# Patient Record
Sex: Female | Born: 1965 | Race: White | Hispanic: No | Smoking: Former smoker
Health system: Southern US, Community
[De-identification: ages and names within clinical notes are randomized; demographics above are authoritative.]

## PROBLEM LIST (undated history)

## (undated) DIAGNOSIS — E785 Hyperlipidemia, unspecified: Secondary | ICD-10-CM

## (undated) DIAGNOSIS — I10 Essential (primary) hypertension: Secondary | ICD-10-CM

## (undated) DIAGNOSIS — Z9289 Personal history of other medical treatment: Secondary | ICD-10-CM

## (undated) DIAGNOSIS — C801 Malignant (primary) neoplasm, unspecified: Secondary | ICD-10-CM

## (undated) DIAGNOSIS — T8859XA Other complications of anesthesia, initial encounter: Secondary | ICD-10-CM

## (undated) DIAGNOSIS — T7840XA Allergy, unspecified, initial encounter: Secondary | ICD-10-CM

## (undated) DIAGNOSIS — C539 Malignant neoplasm of cervix uteri, unspecified: Secondary | ICD-10-CM

## (undated) DIAGNOSIS — Z01419 Encounter for gynecological examination (general) (routine) without abnormal findings: Secondary | ICD-10-CM

## (undated) DIAGNOSIS — K635 Polyp of colon: Secondary | ICD-10-CM

## (undated) DIAGNOSIS — M1990 Unspecified osteoarthritis, unspecified site: Secondary | ICD-10-CM

## (undated) DIAGNOSIS — Z8739 Personal history of other diseases of the musculoskeletal system and connective tissue: Secondary | ICD-10-CM

## (undated) DIAGNOSIS — E669 Obesity, unspecified: Secondary | ICD-10-CM

## (undated) HISTORY — PX: OTHER SURGICAL HISTORY: SHX169

## (undated) HISTORY — PX: TONSILLECTOMY: SUR1361

## (undated) HISTORY — DX: Allergy, unspecified, initial encounter: T78.40XA

## (undated) HISTORY — DX: Malignant (primary) neoplasm, unspecified: C80.1

## (undated) HISTORY — DX: Malignant neoplasm of cervix uteri, unspecified: C53.9

## (undated) HISTORY — PX: WISDOM TOOTH EXTRACTION: SHX21

## (undated) HISTORY — DX: Encounter for gynecological examination (general) (routine) without abnormal findings: Z01.419

## (undated) HISTORY — DX: Polyp of colon: K63.5

## (undated) HISTORY — PX: DILATION AND CURETTAGE OF UTERUS: SHX78

## (undated) HISTORY — DX: Personal history of other medical treatment: Z92.89

## (undated) HISTORY — DX: Essential (primary) hypertension: I10

## (undated) HISTORY — DX: Hyperlipidemia, unspecified: E78.5

## (undated) HISTORY — DX: Obesity, unspecified: E66.9

---

## 1999-05-08 ENCOUNTER — Other Ambulatory Visit: Admission: RE | Admit: 1999-05-08 | Discharge: 1999-05-08 | Payer: Self-pay | Admitting: Obstetrics and Gynecology

## 2000-02-06 ENCOUNTER — Encounter: Payer: Self-pay | Admitting: Obstetrics and Gynecology

## 2000-02-06 ENCOUNTER — Inpatient Hospital Stay (HOSPITAL_COMMUNITY): Admission: AD | Admit: 2000-02-06 | Discharge: 2000-02-06 | Payer: Self-pay | Admitting: Obstetrics and Gynecology

## 2000-02-09 ENCOUNTER — Encounter (INDEPENDENT_AMBULATORY_CARE_PROVIDER_SITE_OTHER): Payer: Self-pay | Admitting: Specialist

## 2000-02-09 ENCOUNTER — Other Ambulatory Visit: Admission: RE | Admit: 2000-02-09 | Discharge: 2000-02-09 | Payer: Self-pay | Admitting: Obstetrics and Gynecology

## 2002-01-20 ENCOUNTER — Other Ambulatory Visit: Admission: RE | Admit: 2002-01-20 | Discharge: 2002-01-20 | Payer: Self-pay | Admitting: Obstetrics and Gynecology

## 2003-02-20 ENCOUNTER — Other Ambulatory Visit: Admission: RE | Admit: 2003-02-20 | Discharge: 2003-02-20 | Payer: Self-pay | Admitting: Obstetrics and Gynecology

## 2003-11-08 ENCOUNTER — Encounter: Admission: RE | Admit: 2003-11-08 | Discharge: 2003-11-08 | Payer: Self-pay | Admitting: Family Medicine

## 2005-05-05 ENCOUNTER — Other Ambulatory Visit: Admission: RE | Admit: 2005-05-05 | Discharge: 2005-05-05 | Payer: Self-pay | Admitting: Obstetrics and Gynecology

## 2006-03-22 ENCOUNTER — Ambulatory Visit: Payer: Self-pay | Admitting: Family Medicine

## 2007-04-25 ENCOUNTER — Ambulatory Visit: Payer: Self-pay | Admitting: Family Medicine

## 2007-12-21 ENCOUNTER — Ambulatory Visit: Payer: Self-pay | Admitting: Family Medicine

## 2008-10-03 ENCOUNTER — Ambulatory Visit: Payer: Self-pay | Admitting: Family Medicine

## 2009-06-26 ENCOUNTER — Ambulatory Visit: Payer: Self-pay | Admitting: Family Medicine

## 2010-04-08 ENCOUNTER — Ambulatory Visit: Payer: Self-pay | Admitting: Physician Assistant

## 2011-02-03 ENCOUNTER — Other Ambulatory Visit: Payer: Self-pay | Admitting: Family Medicine

## 2011-02-03 NOTE — Telephone Encounter (Signed)
Called in lisinopril 20/25 #30 0 refill

## 2011-02-13 ENCOUNTER — Encounter: Payer: Self-pay | Admitting: Family Medicine

## 2011-02-17 ENCOUNTER — Telehealth: Payer: Self-pay

## 2011-02-17 ENCOUNTER — Ambulatory Visit (INDEPENDENT_AMBULATORY_CARE_PROVIDER_SITE_OTHER): Payer: 59 | Admitting: Family Medicine

## 2011-02-17 ENCOUNTER — Encounter: Payer: Self-pay | Admitting: Family Medicine

## 2011-02-17 VITALS — BP 130/86 | HR 72 | Wt 293.0 lb

## 2011-02-17 DIAGNOSIS — Z79899 Other long term (current) drug therapy: Secondary | ICD-10-CM

## 2011-02-17 DIAGNOSIS — E785 Hyperlipidemia, unspecified: Secondary | ICD-10-CM

## 2011-02-17 DIAGNOSIS — Z800 Family history of malignant neoplasm of digestive organs: Secondary | ICD-10-CM

## 2011-02-17 DIAGNOSIS — J309 Allergic rhinitis, unspecified: Secondary | ICD-10-CM | POA: Insufficient documentation

## 2011-02-17 DIAGNOSIS — J3089 Other allergic rhinitis: Secondary | ICD-10-CM

## 2011-02-17 DIAGNOSIS — I10 Essential (primary) hypertension: Secondary | ICD-10-CM

## 2011-02-17 LAB — CBC WITH DIFFERENTIAL/PLATELET
Basophils Absolute: 0 10*3/uL (ref 0.0–0.1)
Eosinophils Absolute: 0.1 10*3/uL (ref 0.0–0.7)
Eosinophils Relative: 1 % (ref 0–5)
HCT: 40.9 % (ref 36.0–46.0)
MCH: 31.5 pg (ref 26.0–34.0)
MCHC: 32.5 g/dL (ref 30.0–36.0)
MCV: 96.9 fL (ref 78.0–100.0)
Monocytes Absolute: 0.5 10*3/uL (ref 0.1–1.0)
Platelets: 244 10*3/uL (ref 150–400)
RDW: 12.8 % (ref 11.5–15.5)

## 2011-02-17 LAB — LIPID PANEL
Cholesterol: 144 mg/dL (ref 0–200)
HDL: 52 mg/dL (ref >39)
Total CHOL/HDL Ratio: 2.8 Ratio
VLDL: 12 mg/dL (ref 0–40)

## 2011-02-17 LAB — COMPREHENSIVE METABOLIC PANEL
ALT: 27 U/L (ref 0–35)
AST: 20 U/L (ref 0–37)
Albumin: 4.3 g/dL (ref 3.5–5.2)
Alkaline Phosphatase: 83 U/L (ref 39–117)
BUN: 12 mg/dL (ref 6–23)
CO2: 24 mEq/L (ref 19–32)
Calcium: 9.3 mg/dL (ref 8.4–10.5)
Chloride: 101 mEq/L (ref 96–112)
Creat: 0.68 mg/dL (ref 0.50–1.10)
Glucose, Bld: 102 mg/dL — ABNORMAL HIGH (ref 70–99)
Potassium: 4.2 mEq/L (ref 3.5–5.3)
Sodium: 136 mEq/L (ref 135–145)
Total Bilirubin: 0.6 mg/dL (ref 0.3–1.2)
Total Protein: 6.8 g/dL (ref 6.0–8.3)

## 2011-02-17 MED ORDER — LISINOPRIL-HYDROCHLOROTHIAZIDE 20-25 MG PO TABS: 1.0000 | ORAL_TABLET | Freq: Every day | ORAL | Status: DC

## 2011-02-17 MED ORDER — ATORVASTATIN CALCIUM 20 MG PO TABS: 20.0000 mg | ORAL_TABLET | Freq: Every day | ORAL | Status: DC

## 2011-02-17 NOTE — Telephone Encounter (Signed)
LEFT MESSAGE FOR PT NURSE VISIT FOR PREP IS June 27 @ 1;30 PROCEDURE July 11 @ 200 CHECK IN AT 1 LE [facility] [phone]

## 2011-02-17 NOTE — Patient Instructions (Signed)
Take Zyrtec regularly for the next week or 2 to see if this will stop the cough. If it doesn't work then stop your blood pressure medicine and see if that affects her coughing. And let me know

## 2011-02-17 NOTE — Progress Notes (Signed)
  Subjective:    Patient ID: Christina Velasquez, female    DOB: 1966-06-06, 45 y.o.   MRN: [id number]  HPI He is here for medication recheck. She continues on her lisinopril and Lipitor. She has noted over the last several months a slight dry cough. She does have underlying allergies. She states that her cough is usually more in the morning and again in the afternoons she does have some sneezing, itchy watery eyes and rhinorrhea. She does use Zyrtec     Review of Systems Negative except as above    Objective:   Physical Exam alert and in no distress. Tympanic membranes and canals are normal. Throat is clear. Tonsils are normal. Neck is supple without adenopathy or thyromegaly. Cardiac exam shows a regular sinus rhythm without murmurs or gallops. Lungs are clear to auscultation.        Assessment & Plan:  Hypertension. Hyperlipidemia. Obesity. Cough. Allergic rhinitis  Routine blood screening. Also recommend she use Zyrtec regularly to see if this will help with the cough. If not then she is to stop her lisinopril and let me know if this helps the cough. I also discussed routine followup in regard to mammogram, colonoscopy and Pap smears.

## 2011-02-18 ENCOUNTER — Telehealth: Payer: Self-pay

## 2011-02-18 NOTE — Telephone Encounter (Signed)
Left message labs look good 

## 2011-03-03 ENCOUNTER — Ambulatory Visit (AMBULATORY_SURGERY_CENTER): Payer: 59 | Admitting: *Deleted

## 2011-03-03 ENCOUNTER — Encounter: Payer: Self-pay | Admitting: Gastroenterology

## 2011-03-03 VITALS — Ht 68.0 in | Wt 299.9 lb

## 2011-03-03 DIAGNOSIS — Z800 Family history of malignant neoplasm of digestive organs: Secondary | ICD-10-CM

## 2011-03-03 MED ORDER — PEG-KCL-NACL-NASULF-NA ASC-C 100 G PO SOLR: ORAL | Status: DC

## 2011-03-05 ENCOUNTER — Other Ambulatory Visit: Payer: Self-pay | Admitting: Family Medicine

## 2011-03-18 ENCOUNTER — Encounter: Payer: Self-pay | Admitting: Gastroenterology

## 2011-03-18 ENCOUNTER — Ambulatory Visit (AMBULATORY_SURGERY_CENTER): Payer: 59 | Admitting: Gastroenterology

## 2011-03-18 VITALS — HR 84 | Temp 97.1°F | Resp 100 | Ht 68.0 in | Wt 295.0 lb

## 2011-03-18 DIAGNOSIS — D126 Benign neoplasm of colon, unspecified: Secondary | ICD-10-CM

## 2011-03-18 DIAGNOSIS — K5730 Diverticulosis of large intestine without perforation or abscess without bleeding: Secondary | ICD-10-CM

## 2011-03-18 DIAGNOSIS — Z1211 Encounter for screening for malignant neoplasm of colon: Secondary | ICD-10-CM

## 2011-03-18 DIAGNOSIS — Z800 Family history of malignant neoplasm of digestive organs: Secondary | ICD-10-CM

## 2011-03-18 DIAGNOSIS — Z86010 Personal history of colonic polyps: Secondary | ICD-10-CM | POA: Insufficient documentation

## 2011-03-18 HISTORY — DX: Benign neoplasm of colon, unspecified: D12.6

## 2011-03-18 MED ORDER — SODIUM CHLORIDE 0.9 % IV SOLN: 500.0000 mL | INTRAVENOUS | Status: DC

## 2011-03-18 NOTE — Patient Instructions (Signed)
Discharge instructions given with verbal understanding. Handout on polyps and diverticulosis given. Resume previous medications. 

## 2011-03-19 ENCOUNTER — Telehealth: Payer: Self-pay

## 2011-03-19 NOTE — Telephone Encounter (Signed)

## 2011-03-24 ENCOUNTER — Encounter: Payer: Self-pay | Admitting: Gastroenterology

## 2011-03-29 DIAGNOSIS — K635 Polyp of colon: Secondary | ICD-10-CM

## 2011-03-29 HISTORY — DX: Polyp of colon: K63.5

## 2011-08-10 ENCOUNTER — Encounter: Payer: Self-pay | Admitting: Medical

## 2011-08-10 ENCOUNTER — Ambulatory Visit (INDEPENDENT_AMBULATORY_CARE_PROVIDER_SITE_OTHER): Payer: 59 | Admitting: Medical

## 2011-08-10 VITALS — BP 112/80 | HR 72 | Temp 98.2°F | Resp 16 | Wt 291.0 lb

## 2011-08-10 DIAGNOSIS — J40 Bronchitis, not specified as acute or chronic: Secondary | ICD-10-CM

## 2011-08-10 DIAGNOSIS — Z23 Encounter for immunization: Secondary | ICD-10-CM

## 2011-08-10 DIAGNOSIS — J329 Chronic sinusitis, unspecified: Secondary | ICD-10-CM | POA: Insufficient documentation

## 2011-08-10 MED ORDER — AMOXICILLIN 500 MG PO TABS: ORAL_TABLET | ORAL | Status: DC

## 2011-08-10 MED ORDER — BENZONATATE 100 MG PO CAPS: 100.0000 mg | ORAL_CAPSULE | Freq: Four times a day (QID) | ORAL | Status: DC | PRN

## 2011-08-10 NOTE — Progress Notes (Signed)
Subjective:   HPI  Christina Velasquez is a 45 y.o. female who presents with 4 day hx/o cough, tightness in chest, sinus and head congestion, malaise, fever to 103, chills, sweats, and sick contact at work.   She feels as though symptoms are worsening into chest.   Using Tylenol and Motrin for fever.  She does notes hx/o bronchitis, but denies hx/o tobacco use.  Using Mucinex OTC.  No other aggravating or relieving factors.    No other c/o.  The following portions of the patient's history were reviewed and updated as appropriate: allergies, current medications, past family history, past medical history, past social history, past surgical history and problem list.  Past Medical History  Diagnosis Date  . Hypertension   . Obesity   . Dyslipidemia   . Allergy   . Cancer     cervix   Review of Systems Constitutional: +fever, +chills, +sweats, -unexpected -weight change,-fatigue ENT: +runny nose, +ear pain, -sore throat Cardiology:  -chest pain, -palpitations, -edema Respiratory: +cough, +shortness of breath, -wheezing Gastroenterology: -abdominal pain, -nausea, -vomiting, -diarrhea, -constipation Hematology: -bleeding or bruising problems Musculoskeletal: -arthralgias, -myalgias, -joint swelling, +back pain Ophthalmology: -vision changes Urology: -dysuria, -difficulty urinating, -hematuria, -urinary frequency, -urgency Neurology: +headache, -weakness, -tingling, -numbness   Objective:   Filed Vitals:   08/10/11 1350  BP: 112/80  Pulse: 72  Temp: 98.2 F (36.8 C)  Resp: 16    General appearance: Alert, WD/WN, no distress, ill appearing, deep rattly cough                             Skin: warm, no rash, no diaphoresis                           Head: no sinus tenderness                            Eyes: conjunctiva normal, corneas clear, PERRLA                            Ears: pearly TMs, external ear canals normal                          Nose: septum midline, turbinates  swollen, with erythema and clear discharge             Mouth/throat: MMM, tongue normal, mild pharyngeal erythema                           Neck: supple, no adenopathy, no thyromegaly, nontender                          Heart: RRR, normal S1, S2, no murmurs                         Lungs: +bronchial breath sounds, +scattered rhonchi, no wheezes, no rales                Extremities: no edema, nontender     Assessment and Plan:   Encounter Diagnoses  Name Primary?  . Sinusitis Yes  . Bronchitis   . Need for prophylactic vaccination and inoculation against influenza    Prescription given today for Amoxicillin and Tessalon  Perles as below.  Discussed diagnosis and treatment.  Suggested symptomatic OTC remedies for cough and congestion.  Nasal saline spray for nasal congestion.  Tylenol or Ibuprofen OTC for fever and malaise.  Call/return in 2-3 days if symptoms are worse or not improving.  Advised that cough may linger even after the infection is improved.     VIS, vaccine counseling, and flu vaccine given.

## 2011-08-10 NOTE — Patient Instructions (Signed)
Acute Bronchitis Bronchitis is a problem of the air tubes leading to your lungs. Acute means the illness started quickly. In this condition, the lining of those tubes becomes puffy (swollen) and can leak fluid. This makes it harder for air to get in and out of your lungs. You may cough a lot. This is because the air tubes are narrow. Bronchitis is most often caused by a virus. Medicines that kill germs (antibiotics) may be needed with germ (bacteria) infections for people who:  Smoke.   Have lasting (chronic) lung problems.   Are elderly.  HOME CARE  Rest.   Drink enough water and fluids to keep the pee clear or pale yellow.   Only take medicine as told by your doctor.   Medicines may be prescribed that will open up the airways. This will help make breathing easier.   Bronchitis usually gets better on its own in a few days.  Recovery from some problems (symptoms) of bronchitis may be slow. You should start feeling a little better after 2 to 3 days. Coughing may last for 3 to 4 weeks. GET HELP RIGHT AWAY IF:  You or your child has a temperature by mouth above 101, not controlled by medicine.   Chills or chest pain develops.   You or your child develops very bad shortness of breath.   There is bloody saliva mixed with mucus (sputum).   You or your child throws up (vomits) often, loses too much fluid (dehydration), feels faint, or has a very bad headache.   You or your child does not improve after 1 week of treatment.  MAKE SURE YOU:   Understand these instructions.   Will watch this condition.   Will get help right away if you or your child is not doing well or gets worse.  Document Released: 02/10/2008 Document Re-Released: 11/18/2009 [employer] Patient Information 2011 [employer].  

## 2011-09-24 ENCOUNTER — Ambulatory Visit (INDEPENDENT_AMBULATORY_CARE_PROVIDER_SITE_OTHER): Payer: 59 | Admitting: Family Medicine

## 2011-09-24 ENCOUNTER — Ambulatory Visit (HOSPITAL_COMMUNITY)
Admission: RE | Admit: 2011-09-24 | Discharge: 2011-09-24 | Disposition: A | Discharge status: Home / Self Care | Payer: 59 | Source: Ambulatory Visit | Attending: Family Medicine | Admitting: Family Medicine

## 2011-09-24 ENCOUNTER — Encounter: Payer: Self-pay | Admitting: Family Medicine

## 2011-09-24 VITALS — BP 126/80 | HR 76 | Ht 68.0 in | Wt 294.0 lb

## 2011-09-24 DIAGNOSIS — M79604 Pain in right leg: Secondary | ICD-10-CM

## 2011-09-24 DIAGNOSIS — M79609 Pain in unspecified limb: Secondary | ICD-10-CM

## 2011-09-24 NOTE — Progress Notes (Signed)
  Subjective:    Patient ID: Christina Velasquez, female    DOB: 11-Jun-1966, 46 y.o.   MRN: [id number]  HPI Approximately one month ago she fell and did injure her right leg. Since then she has had intermittent right calf pain especially when she would go to sleep. 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 In the last week she has noted increased right calf pain. No cough, congestion, shortness of breath or chest pain   Review of Systems     Objective:   Physical Exam Alert and in no distress. Exam of her right lower extremity does show positive Homans sign. It is not warm or swollen. Both calves are the same size. Venous Dopplers were ordered and were negative.       Assessment & Plan:  Calf pain. Recommend heat and Aleve. She will call if further difficulties

## 2011-09-24 NOTE — Progress Notes (Signed)
*  PRELIMINARY RESULTS*   Right lower extremity venous Doppler completed.  There is no DVT, SVT, or Baker's cyst in the right lower extremity.   Christina Velasquez 09/24/2011, 2:55 PM

## 2011-09-24 NOTE — Patient Instructions (Signed)
Recommend conservative care with heat and anti-inflammatory of choice.

## 2012-03-10 ENCOUNTER — Other Ambulatory Visit: Payer: Self-pay | Admitting: Family Medicine

## 2012-03-28 ENCOUNTER — Encounter: Payer: 59 | Admitting: Family Medicine

## 2012-04-04 ENCOUNTER — Telehealth: Payer: Self-pay | Admitting: Family Medicine

## 2012-04-04 ENCOUNTER — Ambulatory Visit (INDEPENDENT_AMBULATORY_CARE_PROVIDER_SITE_OTHER): Payer: 59 | Admitting: Family Medicine

## 2012-04-04 ENCOUNTER — Encounter: Payer: Self-pay | Admitting: Family Medicine

## 2012-04-04 VITALS — BP 128/80 | HR 87 | Wt 293.0 lb

## 2012-04-04 DIAGNOSIS — Z6841 Body Mass Index (BMI) 40.0 and over, adult: Secondary | ICD-10-CM

## 2012-04-04 DIAGNOSIS — E785 Hyperlipidemia, unspecified: Secondary | ICD-10-CM

## 2012-04-04 DIAGNOSIS — Z79899 Other long term (current) drug therapy: Secondary | ICD-10-CM

## 2012-04-04 DIAGNOSIS — Z23 Encounter for immunization: Secondary | ICD-10-CM

## 2012-04-04 DIAGNOSIS — I10 Essential (primary) hypertension: Secondary | ICD-10-CM

## 2012-04-04 LAB — LIPID PANEL
LDL Cholesterol: 85 mg/dL (ref 0–99)
Triglycerides: 64 mg/dL (ref <150)

## 2012-04-04 LAB — COMPREHENSIVE METABOLIC PANEL
Albumin: 4.3 g/dL (ref 3.5–5.2)
Alkaline Phosphatase: 69 U/L (ref 39–117)
CO2: 26 mEq/L (ref 19–32)
Calcium: 9.5 mg/dL (ref 8.4–10.5)
Chloride: 103 mEq/L (ref 96–112)
Glucose, Bld: 97 mg/dL (ref 70–99)
Potassium: 4.2 mEq/L (ref 3.5–5.3)
Sodium: 138 mEq/L (ref 135–145)
Total Protein: 6.8 g/dL (ref 6.0–8.3)

## 2012-04-04 LAB — CBC WITH DIFFERENTIAL/PLATELET
Basophils Relative: 1 % (ref 0–1)
HCT: 39.2 % (ref 36.0–46.0)
Hemoglobin: 13.6 g/dL (ref 12.0–15.0)
Lymphocytes Relative: 28 % (ref 12–46)
Lymphs Abs: 1.8 10*3/uL (ref 0.7–4.0)
Monocytes Absolute: 0.5 10*3/uL (ref 0.1–1.0)
Monocytes Relative: 8 % (ref 3–12)
Neutro Abs: 3.8 10*3/uL (ref 1.7–7.7)
Neutrophils Relative %: 61 % (ref 43–77)
RBC: 4.22 MIL/uL (ref 3.87–5.11)
WBC: 6.2 10*3/uL (ref 4.0–10.5)

## 2012-04-04 MED ORDER — ATORVASTATIN CALCIUM 20 MG PO TABS: 20.0000 mg | ORAL_TABLET | Freq: Every day | ORAL | Status: DC

## 2012-04-04 MED ORDER — LISINOPRIL-HYDROCHLOROTHIAZIDE 20-25 MG PO TABS: 1.0000 | ORAL_TABLET | Freq: Every day | ORAL | Status: DC

## 2012-04-04 NOTE — Progress Notes (Signed)
  Subjective:    Patient ID: Christina Velasquez, female    DOB: 04-22-1966, 46 y.o.   MRN: [id number]  HPI She is here for medication check. She needs refills on her Lipitor. She also continues on lisinopril and is having no difficulty with this. Review of record indicates need for followup tetanus shot. She also has not had blood work and approximately one year. Her allergies seem to be under good control. The main issue she is having is with back pain. Review of record indicates she does have some arthritic changes and spurring. She complains of upper as well as lower back discomfort She has been seen by orthopedics in the past. She is interested in potentially getting attacks break on the recent purchase of a therapeutic pole.   Review of Systems     Objective:   Physical Exam alert and in no distress. Tympanic membranes and canals are normal. Throat is clear. Tonsils are normal. Neck is supple without adenopathy or thyromegaly. Cardiac exam shows a regular sinus rhythm without murmurs or gallops. Lungs are clear to auscultation.       Assessment & Plan:   1. Morbid obesity with BMI of 40.0-44.9, adult    2. Hyperlipidemia  atorvastatin (LIPITOR) 20 MG tablet  3. Hypertension  lisinopril-hydrochlorothiazide (PRINZIDE,ZESTORETIC) 20-25 MG per tablet  4. Encounter for long-term (current) use of other medications  Tdap vaccine greater than or equal to 7yo IM, CBC with Differential, Comprehensive metabolic panel, Lipid panel   her medications were reviewed. He Given. Discussed her physical activities as well as general lifestyle in regard to diet and exercise. 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 At this point she is not ready to sit down with a nutritionist or therapist concerning all this to

## 2012-04-13 NOTE — Telephone Encounter (Signed)
LM

## 2012-05-19 ENCOUNTER — Other Ambulatory Visit: Payer: Self-pay | Admitting: Family Medicine

## 2012-09-07 HISTORY — PX: COLONOSCOPY: SHX174

## 2012-11-14 ENCOUNTER — Other Ambulatory Visit: Payer: Self-pay | Admitting: Family Medicine

## 2013-01-17 ENCOUNTER — Other Ambulatory Visit: Payer: Self-pay | Admitting: Family Medicine

## 2013-04-04 ENCOUNTER — Encounter: Payer: Self-pay | Admitting: Family Medicine

## 2013-04-04 ENCOUNTER — Ambulatory Visit (INDEPENDENT_AMBULATORY_CARE_PROVIDER_SITE_OTHER): Payer: 59 | Admitting: Family Medicine

## 2013-04-04 VITALS — BP 130/88 | HR 70 | Wt 289.0 lb

## 2013-04-04 DIAGNOSIS — M25569 Pain in unspecified knee: Secondary | ICD-10-CM

## 2013-04-04 DIAGNOSIS — M25561 Pain in right knee: Secondary | ICD-10-CM

## 2013-04-04 NOTE — Progress Notes (Signed)
  Subjective:    Patient ID: Christina Velasquez, female    DOB: 1966-02-10, 47 y.o.   MRN: [id number]  HPI She is here for reevaluation of right leg pain. She had an evaluation in January 2013 mainly for DVT which was negative  and continues to have pain. It bothers her the most when she is lying or sitting. Standing actually improves the pain. Apparently the symptoms are getting worse and she is now limping.   Review of Systems     Objective:   Physical Exam No palpable tenderness in the popliteal fossa area. Negative Homan's sign. Pulses are normal. Good motion of her knee.       Assessment & Plan:  Right knee pain - Plan: DG Knee 1-2 Views Right, Ambulatory referral to Orthopedic Surgery  I will refer her specifically to Dr. Prince Rome for possible ultrasound of this area.

## 2013-04-10 ENCOUNTER — Ambulatory Visit
Admission: RE | Admit: 2013-04-10 | Discharge: 2013-04-10 | Disposition: A | Discharge status: Home / Self Care | Payer: 59 | Source: Ambulatory Visit | Attending: Family Medicine | Admitting: Family Medicine

## 2013-04-10 DIAGNOSIS — M25561 Pain in right knee: Secondary | ICD-10-CM

## 2013-04-18 ENCOUNTER — Encounter: Payer: 59 | Admitting: Family Medicine

## 2013-04-21 ENCOUNTER — Other Ambulatory Visit: Payer: Self-pay | Admitting: Family Medicine

## 2013-04-21 ENCOUNTER — Ambulatory Visit (INDEPENDENT_AMBULATORY_CARE_PROVIDER_SITE_OTHER): Payer: 59 | Admitting: Family Medicine

## 2013-04-21 ENCOUNTER — Encounter: Payer: Self-pay | Admitting: Family Medicine

## 2013-04-21 VITALS — BP 122/80 | HR 77 | Wt 289.0 lb

## 2013-04-21 DIAGNOSIS — I10 Essential (primary) hypertension: Secondary | ICD-10-CM

## 2013-04-21 DIAGNOSIS — J3089 Other allergic rhinitis: Secondary | ICD-10-CM

## 2013-04-21 DIAGNOSIS — Z800 Family history of malignant neoplasm of digestive organs: Secondary | ICD-10-CM

## 2013-04-21 DIAGNOSIS — E785 Hyperlipidemia, unspecified: Secondary | ICD-10-CM

## 2013-04-21 MED ORDER — LISINOPRIL-HYDROCHLOROTHIAZIDE 20-25 MG PO TABS: ORAL_TABLET | ORAL | Status: DC

## 2013-04-21 MED ORDER — ATORVASTATIN CALCIUM 20 MG PO TABS: ORAL_TABLET | ORAL | Status: DC

## 2013-04-21 NOTE — Patient Instructions (Addendum)
Take time out for yourself. Knowledge unused is wasted Use the serenity prayer.

## 2013-04-21 NOTE — Progress Notes (Signed)
  Subjective:    Patient ID: Christina Velasquez, female    DOB: 07/09/1966, 47 y.o.   MRN: [id number]  HPI She is here for medication check. She ran out of her Lipitor 2 weeks ago. When asked her why she ran out and did not call me, she could not give a good response.  She is about to run out of the lisinopril . She admits to not dieting and exercising and blames this on her very busy work schedule. Her chart was reviewed. She is up-to-date on her general medical needs. Social and family history were reviewed.  Review of Systems     Objective:   Physical Exam Alert and in no distress otherwise not examined       Assessment & Plan:  Obesity, Class III, BMI 40-49.9 (morbid obesity)  Hypertension  Hyperlipidemia  Family history of colon cancer  Allergic rhinitis due to other allergen  over half an hour spent discussing her lifestyle especially revolving around work. She admits to being a control freak. She admits to taking over spots ability is of other people. I then discussed the possibility of her getting involved in counseling however she did not seem interested in this. She recognizes her problem but at this point seems to be unwilling to make any changes. I stressed the need for her to put her own medical needs higher in her priority list. I will renew her medications and recheck here in several months.

## 2013-06-21 ENCOUNTER — Encounter: Payer: Self-pay | Admitting: Internal Medicine

## 2013-06-21 ENCOUNTER — Encounter: Payer: Self-pay | Admitting: Family Medicine

## 2013-06-21 ENCOUNTER — Ambulatory Visit (INDEPENDENT_AMBULATORY_CARE_PROVIDER_SITE_OTHER): Payer: 59 | Admitting: Family Medicine

## 2013-06-21 VITALS — BP 120/80 | HR 87 | Wt 294.0 lb

## 2013-06-21 DIAGNOSIS — Z79899 Other long term (current) drug therapy: Secondary | ICD-10-CM

## 2013-06-21 DIAGNOSIS — E785 Hyperlipidemia, unspecified: Secondary | ICD-10-CM

## 2013-06-21 DIAGNOSIS — I10 Essential (primary) hypertension: Secondary | ICD-10-CM

## 2013-06-21 DIAGNOSIS — E66813 Obesity, class 3: Secondary | ICD-10-CM

## 2013-06-21 LAB — LIPID PANEL
Cholesterol: 138 mg/dL (ref 0–200)
LDL Cholesterol: 71 mg/dL (ref 0–99)
Total CHOL/HDL Ratio: 2.5 Ratio
Triglycerides: 57 mg/dL (ref <150)
VLDL: 11 mg/dL (ref 0–40)

## 2013-06-21 LAB — COMPREHENSIVE METABOLIC PANEL
Alkaline Phosphatase: 72 U/L (ref 39–117)
Creat: 0.68 mg/dL (ref 0.50–1.10)
Glucose, Bld: 91 mg/dL (ref 70–99)
Sodium: 138 mEq/L (ref 135–145)
Total Bilirubin: 0.7 mg/dL (ref 0.3–1.2)
Total Protein: 6.2 g/dL (ref 6.0–8.3)

## 2013-06-21 LAB — CBC WITH DIFFERENTIAL/PLATELET
Basophils Relative: 0 % (ref 0–1)
Eosinophils Absolute: 0.2 10*3/uL (ref 0.0–0.7)
Eosinophils Relative: 3 % (ref 0–5)
MCH: 31.6 pg (ref 26.0–34.0)
MCHC: 33.5 g/dL (ref 30.0–36.0)
MCV: 94.4 fL (ref 78.0–100.0)
Monocytes Relative: 6 % (ref 3–12)
Neutrophils Relative %: 72 % (ref 43–77)
Platelets: 228 10*3/uL (ref 150–400)

## 2013-06-21 NOTE — Progress Notes (Signed)
  Subjective:    Patient ID: Christina Velasquez, female    DOB: [birth year], 47 y.o.   MRN: [id number]  HPI She is here for recheck. She is on medications listed in the chart. She continues to have difficulty mainly with knee pain. She is being followed by orthopedics for this. She does intermittently make efforts to lose weight but usually allows everything to get in the way.   Review of Systems     Objective:   Physical Exam Alert and in no distress. Blood pressure is recorded.      Assessment & Plan:  Obesity, Class III, BMI 40-49.9 (morbid obesity) - Plan: CBC with Differential, Comprehensive metabolic panel, Lipid panel  Hypertension - Plan: CBC with Differential, Comprehensive metabolic panel  Hyperlipidemia - Plan: CBC with Differential, Comprehensive metabolic panel  Encounter for long-term (current) use of other medications - Plan: CBC with Differential, Comprehensive metabolic panel, Lipid panel  discussed in detail lifestyle modification however in her case she seems to put everybody else's wants needs and desires above her own. Discussed the fact that counseling could be very useful to help with this. At this time she is not interested.  She has already had a flu shot.

## 2013-08-01 LAB — HM PAP SMEAR

## 2013-08-01 LAB — RESULTS CONSOLE HPV: CHL HPV: NEGATIVE

## 2013-08-15 LAB — HM MAMMOGRAPHY

## 2013-09-13 ENCOUNTER — Ambulatory Visit (INDEPENDENT_AMBULATORY_CARE_PROVIDER_SITE_OTHER): Payer: 59 | Admitting: Medical

## 2013-09-13 ENCOUNTER — Encounter: Payer: Self-pay | Admitting: Medical

## 2013-09-13 VITALS — BP 100/70 | HR 96 | Temp 98.3°F | Resp 18 | Wt 296.0 lb

## 2013-09-13 DIAGNOSIS — J329 Chronic sinusitis, unspecified: Secondary | ICD-10-CM

## 2013-09-13 DIAGNOSIS — J40 Bronchitis, not specified as acute or chronic: Secondary | ICD-10-CM

## 2013-09-13 DIAGNOSIS — R05 Cough: Secondary | ICD-10-CM

## 2013-09-13 DIAGNOSIS — R059 Cough, unspecified: Secondary | ICD-10-CM

## 2013-09-13 DIAGNOSIS — J3489 Other specified disorders of nose and nasal sinuses: Secondary | ICD-10-CM

## 2013-09-13 MED ORDER — METHYLPREDNISOLONE (PAK) 4 MG PO TABS: ORAL_TABLET | ORAL | Status: DC

## 2013-09-13 MED ORDER — HYDROCODONE-HOMATROPINE 5-1.5 MG/5ML PO SYRP: 5.0000 mL | ORAL_SOLUTION | Freq: Three times a day (TID) | ORAL | Status: DC | PRN

## 2013-09-13 MED ORDER — ALBUTEROL SULFATE HFA 108 (90 BASE) MCG/ACT IN AERS: 2.0000 | INHALATION_SPRAY | Freq: Four times a day (QID) | RESPIRATORY_TRACT | Status: DC | PRN

## 2013-09-13 MED ORDER — CLARITHROMYCIN 500 MG PO TABS: 500.0000 mg | ORAL_TABLET | Freq: Two times a day (BID) | ORAL | Status: DC

## 2013-09-13 NOTE — Progress Notes (Signed)
Subjective: Here for illness since Thanksgiving.  Current symptoms include sinus pressure, pressure in ears, left maxillary/orbital pain, cough, cough worse at night.  Using Mucinex, has used some sudafed, day quill, Nyquil.  Did get flu shot this year.  Had fever thanksgiving weekend, but none sense.   With the first symptoms at Thanksgiving, had sore throat, rash all over.  Denies SOB, wheezing.  No swelling in legs.  No NVD.   Still has some of the rash on legs but healing up on arms and abdomen.   Been using benadryl for this.  Work sick contacts.  No sick contacts and measles rubella. She did get her childhood vaccines. No exposures to people from other countries recently  Past Medical History  Diagnosis Date  . Hypertension   . Obesity   . Dyslipidemia   . Allergy   . Cancer     cervix   ROS as in subjective  Objective: Filed Vitals:   09/13/13 0824  BP: 100/70  Pulse: 96  Temp: 98.3 F (36.8 C)  Resp: 18    General appearance: alert, no distress, WD/WN  HEENT: normocephalic, sclerae anicteric, TMs pearly, nares with turbinate edema, mucoid discharge mild erythema, pharynx normal Oral cavity: MMM, no lesions Neck: supple, no lymphadenopathy, no thyromegaly, no masses Heart: RRR, normal S1, S2, no murmurs Lungs: CTA bilaterally, no wheezes, rhonchi, or rales Pulses: 2+ symmetric, upper and lower extremities, normal cap refill Ext: no edema CN: Warm, dry, there is a faintly macular rough rash along her bilateral legs, lower legs, otherwise no obvious rash   Chest x-ray shows bronchial inflammation, otherwise normal cardiac silhouette, no mass, no free air otherwise unremarkable.  Assessment: Encounter Diagnoses  Name Primary?  . Cough Yes  . Sinus pressure     Plan: Prescriptions today for albuterol inhaler, Medrol Dosepak, Biaxin antibiotic, and Hycodan syrup when necessary use. Advise rest, good hydration, can continue the medications for congestion she is taking,  Follow-up if worse or not improving within a week

## 2013-11-27 ENCOUNTER — Encounter: Payer: Self-pay | Admitting: Medical

## 2013-11-27 ENCOUNTER — Ambulatory Visit (INDEPENDENT_AMBULATORY_CARE_PROVIDER_SITE_OTHER): Payer: 59 | Admitting: Medical

## 2013-11-27 VITALS — BP 112/78 | HR 88 | Temp 98.4°F | Resp 16 | Wt 305.0 lb

## 2013-11-27 DIAGNOSIS — L500 Allergic urticaria: Secondary | ICD-10-CM

## 2013-11-27 DIAGNOSIS — T783XXA Angioneurotic edema, initial encounter: Secondary | ICD-10-CM

## 2013-11-27 MED ORDER — EPINEPHRINE 0.3 MG/0.3ML IJ SOAJ: 0.3000 mg | Freq: Once | INTRAMUSCULAR | Status: DC

## 2013-11-27 MED ORDER — HYDROCHLOROTHIAZIDE 25 MG PO TABS: 25.0000 mg | ORAL_TABLET | Freq: Every day | ORAL | Status: DC

## 2013-11-27 MED ORDER — NEBIVOLOL HCL 5 MG PO TABS: 5.0000 mg | ORAL_TABLET | Freq: Every day | ORAL | Status: DC

## 2013-11-27 MED ORDER — METHYLPREDNISOLONE SODIUM SUCC 125 MG IJ SOLR
125.0000 mg | Freq: Once | INTRAMUSCULAR | Status: AC
  Administered 2013-11-27: 125 mg via INTRAMUSCULAR

## 2013-11-27 NOTE — Progress Notes (Signed)
Subjective: Here for followup on rash.  I saw her in January for a respiratory infection, and at that time she had a very mild itchy rash, but the rash has worsened throughout the body, has continued since January.  She is taking Benadryl quite regularly and she will have periods of time where the rash gets a little better, but the rash just won't go away.  Rash is very itchy, and this past weekend Sunday and Monday had swelling of face, left side more and tongue felt a little swelling.  Denies cough.  No prior similar reaction.  The only thing that's been a common feature is eating some slowly-based foods, but she cannot think of any other trigger. She is on lisinopril HCT with no prior problem with lisinopril. She has never been on any other blood pressure medication, and if she is off the lisinopril for 2 days she gets terrible headaches.  Review of Systems Constitutional: -fever, -chills, -sweats, -unexpected weight change,-fatigue ENT: -runny nose, -ear pain, -sore throat Cardiology:  -chest pain, -palpitations, -edema Respiratory: -cough, -shortness of breath, -wheezing Gastroenterology: -abdominal pain, -nausea, -vomiting, -diarrhea, -constipation Hematology: -bleeding or bruising problems Musculoskeletal: -arthralgias, -myalgias, -joint swelling, -back pain Ophthalmology: -vision changes Urology: -dysuria, -difficulty urinating, -hematuria, -urinary frequency, -urgency Neurology: -headache, -weakness, -tingling, -numbness   Filed Vitals:   11/27/13 0842  BP: 112/78  Pulse: 88  Temp: 98.4 F (36.9 C)  Resp: 16    General appearance: alert, no distress, WD/WN Skin: She seems to have small urticarial raised erythematous/pink papular wheal lesions throughout the body, legs, arms, torso, several are scaling due to her excoriations from scratching HEENT: normocephalic, sclerae anicteric, TMs pearly, nares patent, no discharge or erythema, pharynx normal Oral cavity: No obvious angioedema,  MMM, no lesions Lungs: CTA bilaterally, no wheezes, rhonchi, or rales Ext: no edema  Assessment: Encounter Diagnoses  Name Primary?  . Allergic urticaria Yes  . Angioedema    Plan: We discussed the rash, the exam findings, her symptoms, possible causes.  Specific recommendations today include:  We gave you a shot of Solu-Medrol 125 mg steroid today for allergic reaction  You may use Benadryl over-the-counter, 25 mg, 1/2-1 tablet 3 times a day  You can use 25-50 mg of Benadryl at bedtime  You can also use Zantac over-the-counter twice daily, as this will also help with allergic reaction  For right now stop lisinopril HCT in case this is the culprit  Hold off on soy-based foods for the time being  Begin Bystolic 5mg  daily and begin Hydrochlorothiazide 25mg  daily for blood pressure.  We will call with lab results  If worsening swelling of face, tongue, or shortness of breath, use Epi pen and call 911/go to the emergency department   Return 2-3 wk.

## 2013-11-27 NOTE — Patient Instructions (Signed)
Thank you for giving me the opportunity to serve you today.    Your diagnosis today includes: Encounter Diagnoses  Name Primary?  . Allergic urticaria Yes  . Angioedema      Specific recommendations today include:  We gave you a shot of Solu-Medrol 125 mg steroid today for allergic reaction  You may use Benadryl over-the-counter, 25 mg, 1/2-1 tablet 3 times a day  You can use 25-50 mg of Benadryl at bedtime  You can also use Zantac over-the-counter twice daily, as this will also help with allergic reaction  For right now stop lisinopril HCT in case this is the culprit  Hold off on soy-based foods for the time being  Begin Bystolic 5mg  daily and begin Hydrochlorothiazide 25mg  daily for blood pressure.  We will call with lab results  If worsening swelling of face, tongue, or shortness of breath, use Epi pen and call 911/go to the emergency department  Follow up: 2- 3 weeks   I have included other useful information below for your review.  Angioedema Angioedema is a sudden swelling of tissues, often of the skin. It can occur on the face or genitals or in the abdomen or other body parts. The swelling usually develops over a short period and gets better in 24 to 48 hours. It often begins during the night and is found when the person wakes up. The person may also get red, itchy patches of skin (hives). Angioedema can be dangerous if it involves swelling of the air passages.  Depending on the cause, episodes of angioedema may only happen once, come back in unpredictable patterns, or repeat for several years and then gradually fade away.  CAUSES  Angioedema can be caused by an allergic reaction to various triggers. It can also result from nonallergic causes, including reactions to drugs, immune system disorders, viral infections, or an abnormal gene that is passed to you from your parents (hereditary). For some people with angioedema, the cause is unknown.  Some things that can  trigger angioedema include:   Foods.   Medicines, such as ACE inhibitors, ARBs, nonsteroidal anti-inflammatory agents, or estrogen.   Latex.   Animal saliva.   Insect stings.   Dyes used in X-rays.   Mild injury.   Dental work.  Surgery.  Stress.   Sudden changes in temperature.   Exercise. SIGNS AND SYMPTOMS   Swelling of the skin.  Hives. If these are present, there is also intense itching.  Redness in the affected area.   Pain in the affected area.  Swollen lips or tongue.  Breathing problems. This may happen if the air passages swell.  Wheezing. If internal organs are involved, there may be:   Nausea.   Abdominal pain.   Vomiting.   Difficulty swallowing.   Difficulty passing urine. DIAGNOSIS   Your health care provider will examine the affected area and take a medical and family history.  Various tests may be done to help determine the cause. Tests may include:  Allergy skin tests to see if the problem is an allergic reaction.   Blood tests to check for hereditary angioedema.   Tests to check for underlying diseases that could cause the condition.   A review of your medicines, including over the counter medicines, may be done. TREATMENT  Treatment will depend on the cause of the angioedema. Possible treatments include:   Removal of anything that triggered the condition (such as stopping certain medicines).   Medicines to treat symptoms or prevent attacks.  Medicines given may include:   Antihistamines.   Epinephrine injection.   Steroids.   Hospitalization may be required for severe attacks. If the air passages are affected, it can be an emergency. Tubes may need to be placed to keep the airway open. HOME CARE INSTRUCTIONS   Only take over-the-counter or prescription medicines as directed by your health care provider.  If you were given medicines for emergency allergy treatment, always carry them with  you.  Wear a medical bracelet as directed by your health care provider.   Avoid known triggers. SEEK MEDICAL CARE IF:   You have repeat attacks of angioedema.   Your attacks are more frequent or more severe despite preventive measures.   You have hereditary angioedema and are considering having children. It is important to discuss the risks of passing the condition on to your children with your health care provider. SEEK IMMEDIATE MEDICAL CARE IF:   You have severe swelling of the mouth, tongue, or lips.  You have difficulty breathing.   You have difficulty swallowing.   You faint. MAKE SURE YOU:  Understand these instructions.  Will watch your condition.  Will get help right away if you are not doing well or get worse. Document Released: 11/02/2001 Document Revised: 06/14/2013 Document Reviewed: 04/17/2013 [facility] Patient Information 2014 [place], [place].     Hives Hives are itchy, red, swollen areas of the skin. They can vary in size and location on your body. Hives can come and go for hours or several days (acute hives) or for several weeks (chronic hives). Hives do not spread from person to person (noncontagious). They may get worse with scratching, exercise, and emotional stress. CAUSES   Allergic reaction to food, additives, or drugs.  Infections, including the common cold.  Illness, such as vasculitis, lupus, or thyroid disease.  Exposure to sunlight, heat, or cold.  Exercise.  Stress.  Contact with chemicals. SYMPTOMS   Red or white swollen patches on the skin. The patches may change size, shape, and location quickly and repeatedly.  Itching.  Swelling of the hands, feet, and face. This may occur if hives develop deeper in the skin. DIAGNOSIS  Your caregiver can usually tell what is wrong by performing a physical exam. Skin or blood tests may also be done to determine the cause of your hives. In some cases, the cause cannot be  determined. TREATMENT  Mild cases usually get better with medicines such as antihistamines. Severe cases may require an emergency epinephrine injection. If the cause of your hives is known, treatment includes avoiding that trigger.  HOME CARE INSTRUCTIONS   Avoid causes that trigger your hives.  Take antihistamines as directed by your caregiver to reduce the severity of your hives. Non-sedating or low-sedating antihistamines are usually recommended. Do not drive while taking an antihistamine.  Take any other medicines prescribed for itching as directed by your caregiver.  Wear loose-fitting clothing.  Keep all follow-up appointments as directed by your caregiver. SEEK MEDICAL CARE IF:   You have persistent or severe itching that is not relieved with medicine.  You have painful or swollen joints. SEEK IMMEDIATE MEDICAL CARE IF:   You have a fever.  Your tongue or lips are swollen.  You have trouble breathing or swallowing.  You feel tightness in the throat or chest.  You have abdominal pain. These problems may be the first sign of a life-threatening allergic reaction. Call your local emergency services (911 in [place] MAKE SURE YOU:   Understand these  instructions.  Will watch your condition.  Will get help right away if you are not doing well or get worse. Document Released: 08/24/2005 Document Revised: 02/23/2012 Document Reviewed: 11/17/2011 [facility] Patient Information 2014 [place].

## 2013-11-28 LAB — CBC WITH DIFFERENTIAL/PLATELET
Basophils Absolute: 0.1 10*3/uL (ref 0.0–0.1)
Basophils Relative: 1 % (ref 0–1)
EOS PCT: 2 % (ref 0–5)
Eosinophils Absolute: 0.1 10*3/uL (ref 0.0–0.7)
HEMATOCRIT: 39.7 % (ref 36.0–46.0)
Hemoglobin: 13.1 g/dL (ref 12.0–15.0)
LYMPHS ABS: 1.9 10*3/uL (ref 0.7–4.0)
LYMPHS PCT: 29 % (ref 12–46)
MCH: 30.7 pg (ref 26.0–34.0)
MCHC: 33 g/dL (ref 30.0–36.0)
MCV: 93 fL (ref 78.0–100.0)
MONO ABS: 0.5 10*3/uL (ref 0.1–1.0)
Monocytes Relative: 8 % (ref 3–12)
Neutro Abs: 4 10*3/uL (ref 1.7–7.7)
Neutrophils Relative %: 60 % (ref 43–77)
Platelets: 254 10*3/uL (ref 150–400)
RBC: 4.27 MIL/uL (ref 3.87–5.11)
RDW: 13.8 % (ref 11.5–15.5)
WBC: 6.6 10*3/uL (ref 4.0–10.5)

## 2013-12-13 ENCOUNTER — Encounter: Payer: Self-pay | Admitting: Medical

## 2013-12-13 ENCOUNTER — Ambulatory Visit (INDEPENDENT_AMBULATORY_CARE_PROVIDER_SITE_OTHER): Payer: 59 | Admitting: Medical

## 2013-12-13 VITALS — BP 130/70 | HR 68 | Temp 98.2°F | Resp 16 | Wt 300.0 lb

## 2013-12-13 DIAGNOSIS — L500 Allergic urticaria: Secondary | ICD-10-CM

## 2013-12-13 DIAGNOSIS — R21 Rash and other nonspecific skin eruption: Secondary | ICD-10-CM

## 2013-12-13 MED ORDER — TRIAMCINOLONE ACETONIDE 0.1 % EX CREA: 1.0000 "application " | TOPICAL_CREAM | Freq: Two times a day (BID) | CUTANEOUS | Status: DC

## 2013-12-13 NOTE — Progress Notes (Signed)
Patient has an appointment on 12/20/13 @ 500 pm at [place]. CLS She is aware of this appointment. CLS

## 2013-12-13 NOTE — Progress Notes (Signed)
Subjective: Here for followup on rash.  After last visit 11/27/13 was 100% better for 3 days.  Then the rash and itching started right away again.  Current she is taking 2 benadryl OTC once daily, Zantac once daily, topical lotion to help with itching. She has had no additional facial swelling or tongue swelling.  Currently has larger welts on her legs bilaterally, faint smaller welts on neck and arms.    I saw her in January for a respiratory infection, and at that time she had a very mild itchy rash, but the rash had worsened throughout the body, has continued since January. At her last visit she did have some mild tongue swelling and some generalized facial swelling, not specifically lip swelling or eye/orbit swelling.   Denies cough.  No prior similar reaction.  The only thing that's been a common feature is eating some soy-based foods, but she cannot think of any other trigger.  At her last visit we stopped lisinopril HCT, switch to Bystolic and HCT.  She has not been taking her aspirin to eliminate this as a trigger  She has no other symptoms at this point.  Denies fever, no recent weight loss, she is up-to-date on mammogram Pap smear, has had 3 colonoscopies prior, gets these due to mom's history of colon cancer. No concern for STD, last HIV test 10 years ago. No new breast lumps, no blood in stool.  Pets at home - 3 dogs.    Review of Systems Constitutional: -fever, -chills, -sweats, -unexpected weight change,-fatigue ENT: -runny nose, -ear pain, -sore throat Cardiology:  -chest pain, -palpitations, -edema Respiratory: -cough, -shortness of breath, -wheezing Gastroenterology: -abdominal pain, -nausea, -vomiting, -diarrhea, -constipation  Hematology: -bleeding or bruising problems Musculoskeletal: -arthralgias, -myalgias, -joint swelling, -back pain Ophthalmology: -vision changes Urology: -dysuria, -difficulty urinating, -hematuria, -urinary frequency, -urgency Neurology: -headache,  -weakness, -tingling, -numbness    Objective:  Filed Vitals:   12/13/13 1058  BP: 130/70  Pulse: 68  Temp: 98.2 F (36.8 C)  Resp: 16    General appearance: alert, no distress, WD/WN Skin: She seems to have small urticarial raised erythematous/pink papular wheal lesions throughout the body, legs, arms, torso, several are scaling due to her excoriations from scratching HEENT: normocephalic, sclerae anicteric, TMs pearly, nares patent, no discharge or erythema, pharynx normal Oral cavity: No obvious angioedema, MMM, no lesions Lungs: CTA bilaterally, no wheezes, rhonchi, or rales Ext: no edema   Assessment: Encounter Diagnoses  Name Primary?  . Allergic urticaria Yes  . Rash and nonspecific skin eruption      Plan: After last visit we gave a shot of Solu-Medrol 125 mg steroid, had her use Benadryl over-the-counter, 25 mg, 1/2-1 tablet 3 times a day, Zantac over-the-counter twice daily,stopped lisinopril HCT, held off on soy-based foods for the time being, had her begin Bystolic 5mg  daily and begin Hydrochlorothiazide 25mg  daily for blood pressure, gave Epi pen script.  At this point since this is recurring, we will go ahead and refer to allergist.  Discussed that she may need additional testing if the allergist does not find anything. We discussed possible causes of urticaria.    She can continue Benadryl and Zantac for the next few days, triamcinolone topical cream as needed, but advised to stop all Benadryl and Zantac and creams 3 days prior to the allergist visit  Return referral to allergist.

## 2014-01-15 ENCOUNTER — Other Ambulatory Visit: Payer: Self-pay | Admitting: Physician Assistant

## 2014-02-26 ENCOUNTER — Other Ambulatory Visit: Payer: Self-pay | Admitting: Medical

## 2014-02-26 NOTE — Telephone Encounter (Signed)
PATIENT NEEDS TO SCHEDULE A FOLLOW UP VISIT ON MEDICATION ASAP

## 2014-03-31 ENCOUNTER — Other Ambulatory Visit: Payer: Self-pay | Admitting: Medical

## 2014-04-01 ENCOUNTER — Other Ambulatory Visit: Payer: Self-pay | Admitting: Medical

## 2014-04-02 NOTE — Telephone Encounter (Signed)
PATIENT NEEDS TO SCHEDULE AN APPOINTMENT ASAP

## 2014-05-09 ENCOUNTER — Other Ambulatory Visit: Payer: Self-pay | Admitting: Medical

## 2014-06-14 ENCOUNTER — Other Ambulatory Visit: Payer: Self-pay | Admitting: Medical

## 2014-06-14 NOTE — Telephone Encounter (Signed)
Ok to RF? 

## 2014-07-12 ENCOUNTER — Other Ambulatory Visit: Payer: Self-pay | Admitting: Medical

## 2014-07-24 ENCOUNTER — Telehealth: Payer: Self-pay | Admitting: Medical

## 2014-07-24 ENCOUNTER — Other Ambulatory Visit: Payer: Self-pay | Admitting: Family Medicine

## 2014-07-24 MED ORDER — ATORVASTATIN CALCIUM 20 MG PO TABS: ORAL_TABLET | ORAL | Status: DC

## 2014-07-24 MED ORDER — NEBIVOLOL HCL 5 MG PO TABS: 5.0000 mg | ORAL_TABLET | Freq: Every day | ORAL | Status: DC

## 2014-07-24 NOTE — Telephone Encounter (Signed)
Pt was called and a message was left. Pt needs an appt.

## 2014-07-24 NOTE — Telephone Encounter (Signed)
Pt called and stated she needed refills on Bystolic and [facility]itor [facility] [place].

## 2014-07-24 NOTE — Telephone Encounter (Signed)
This is a Dr. Redmond School patient and she needs an OV. I will send in for only 30 day supply.  I tried to call her at home but no answer

## 2014-08-24 ENCOUNTER — Ambulatory Visit (INDEPENDENT_AMBULATORY_CARE_PROVIDER_SITE_OTHER): Payer: 59 | Admitting: Medical

## 2014-08-24 ENCOUNTER — Encounter: Payer: Self-pay | Admitting: Medical

## 2014-08-24 VITALS — BP 132/88 | HR 83 | Temp 97.6°F | Resp 16 | Wt 322.0 lb

## 2014-08-24 DIAGNOSIS — I10 Essential (primary) hypertension: Secondary | ICD-10-CM | POA: Insufficient documentation

## 2014-08-24 DIAGNOSIS — E785 Hyperlipidemia, unspecified: Secondary | ICD-10-CM

## 2014-08-24 DIAGNOSIS — E669 Obesity, unspecified: Secondary | ICD-10-CM

## 2014-08-24 DIAGNOSIS — Z566 Other physical and mental strain related to work: Secondary | ICD-10-CM

## 2014-08-24 LAB — COMPREHENSIVE METABOLIC PANEL
ALT: 38 U/L — ABNORMAL HIGH (ref 0–35)
AST: 23 U/L (ref 0–37)
Albumin: 4 g/dL (ref 3.5–5.2)
Alkaline Phosphatase: 74 U/L (ref 39–117)
BUN: 13 mg/dL (ref 6–23)
CALCIUM: 9.3 mg/dL (ref 8.4–10.5)
CHLORIDE: 105 meq/L (ref 96–112)
CO2: 23 meq/L (ref 19–32)
CREATININE: 0.65 mg/dL (ref 0.50–1.10)
GLUCOSE: 88 mg/dL (ref 70–99)
Potassium: 4 mEq/L (ref 3.5–5.3)
Sodium: 137 mEq/L (ref 135–145)
Total Bilirubin: 0.6 mg/dL (ref 0.2–1.2)
Total Protein: 6.7 g/dL (ref 6.0–8.3)

## 2014-08-24 LAB — TSH: TSH: 2.291 u[IU]/mL (ref 0.350–4.500)

## 2014-08-24 LAB — HEMOGLOBIN A1C
Hgb A1c MFr Bld: 5.2 % (ref <5.7)
MEAN PLASMA GLUCOSE: 103 mg/dL (ref <117)

## 2014-08-24 LAB — CBC
HEMATOCRIT: 39.8 % (ref 36.0–46.0)
HEMOGLOBIN: 13.8 g/dL (ref 12.0–15.0)
MCH: 32.2 pg (ref 26.0–34.0)
MCHC: 34.7 g/dL (ref 30.0–36.0)
MCV: 92.8 fL (ref 78.0–100.0)
MPV: 9.4 fL (ref 9.4–12.4)
Platelets: 258 10*3/uL (ref 150–400)
RBC: 4.29 MIL/uL (ref 3.87–5.11)
RDW: 13.4 % (ref 11.5–15.5)
WBC: 5.4 10*3/uL (ref 4.0–10.5)

## 2014-08-24 LAB — LIPID PANEL
CHOLESTEROL: 149 mg/dL (ref 0–200)
HDL: 63 mg/dL (ref >39)
LDL Cholesterol: 71 mg/dL (ref 0–99)
TRIGLYCERIDES: 77 mg/dL (ref <150)
Total CHOL/HDL Ratio: 2.4 Ratio
VLDL: 15 mg/dL (ref 0–40)

## 2014-08-24 NOTE — Progress Notes (Signed)
Subjective: Here for routine f/u/med check.  Was taking Lipitor in the morning ,but this was upsetting her stomach.   Taking at night but forgets to take it sometimes.   compliant with her 2 BP medications.   She has gained weight.  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   Gets up 5am, walks her dogs, out the door by 7am, finishing emails by 11pm at night.  Lives with her 24yo daughter.   With the weight gain has more back pain, gets numbness in hands at times, right upper thigh numbness at times.  Not taking gabapentin  No other c/o.      Past Medical History  Diagnosis Date  . Hypertension   . Obesity   . Dyslipidemia   . Allergy   . Cancer     cervix   ROS as in subjective  Objective: BP 132/88 mmHg  Pulse 83  Temp(Src) 97.6 F (36.4 C) (Oral)  Resp 16  Wt 322 lb (146.058 kg)  BP Readings from Last 3 Encounters:  08/24/14 132/88  12/13/13 130/70  11/27/13 112/78   Wt Readings from Last 3 Encounters:  08/24/14 322 lb (146.058 kg)  12/13/13 300 lb (136.079 kg)  11/27/13 305 lb (138.347 kg)   General appearance: alert, no distress, WD/WN, obese white female Oral cavity: MMM, no lesions Neck: supple, no lymphadenopathy, no thyromegaly, no masses Heart: RRR, normal S1, S2, no murmurs Lungs: CTA bilaterally, no wheezes, rhonchi, or rales Abdomen: +bs, soft, non tender, non distended, no masses, no hepatomegaly, no splenomegaly Pulses: 2+ symmetric, upper and lower extremities, normal cap refill Ext: 1+ nonpitting edema of ankles   Assessment: Encounter Diagnoses  Name Primary?  . Essential hypertension Yes  . Hyperlipidemia   . Obesity   . Work-related stress     Plan: Reviewed and updated chart history today.  Advised routine f/u with gynecology, dentist, eye doctor.   Reviewed medications, prior labs, reviewed prior BPs,  weights.  Overall she needs to lose weight, to work on taking care of her self, exercising, eating healthier.   She declines medication to help with weight loss.   She declines nutrition consult.  She was counseled on making lifestyle changes.  Labs today.  C/t same medications.

## 2014-08-25 ENCOUNTER — Other Ambulatory Visit: Payer: Self-pay | Admitting: Medical

## 2014-08-25 MED ORDER — NEBIVOLOL HCL 5 MG PO TABS: 5.0000 mg | ORAL_TABLET | Freq: Every day | ORAL | Status: DC

## 2014-08-25 MED ORDER — HYDROCHLOROTHIAZIDE 25 MG PO TABS: 25.0000 mg | ORAL_TABLET | Freq: Every day | ORAL | Status: DC

## 2014-08-25 MED ORDER — ATORVASTATIN CALCIUM 20 MG PO TABS: ORAL_TABLET | ORAL | Status: DC

## 2015-02-20 ENCOUNTER — Other Ambulatory Visit: Payer: Self-pay | Admitting: Medical

## 2015-04-08 ENCOUNTER — Encounter: Payer: Self-pay | Admitting: Medical

## 2015-04-08 ENCOUNTER — Ambulatory Visit (INDEPENDENT_AMBULATORY_CARE_PROVIDER_SITE_OTHER): Payer: 59 | Admitting: Medical

## 2015-04-08 VITALS — BP 130/82 | HR 72 | Temp 98.3°F | Wt 366.8 lb

## 2015-04-08 DIAGNOSIS — L508 Other urticaria: Secondary | ICD-10-CM

## 2015-04-08 DIAGNOSIS — M7989 Other specified soft tissue disorders: Secondary | ICD-10-CM

## 2015-04-08 DIAGNOSIS — M79605 Pain in left leg: Secondary | ICD-10-CM | POA: Diagnosis not present

## 2015-04-08 LAB — D-DIMER, QUANTITATIVE: D-Dimer, Quant: 0.66 ug/mL-FEU — ABNORMAL HIGH (ref 0.00–0.48)

## 2015-04-08 MED ORDER — CLOBETASOL PROPIONATE 0.05 % EX CREA: 1.0000 "application " | TOPICAL_CREAM | Freq: Two times a day (BID) | CUTANEOUS | Status: DC

## 2015-04-08 NOTE — Progress Notes (Signed)
Subjective: Here for left leg swelling and pain.  She has hx/o chronic urticaria.  She was seen here before for this, ended up seeing allergist, had a bunch of labs that showed nothing worrisome, and was subsequently seen by dermatology.  No specific cause has been determined.  At this point she continues to get the urticaria and rash although the rash has improved with clobetasol cream.  She is out of this currently.   Recently though started getting left leg swelling and some pain in left lateral leg.  Denies recent travel, no hx/o clots, no recent trauma or injury, non smoker, not on OCPs.   Denies SOB, palpitations.  No prior similar in regards to leg swelling.  No other aggravating or relieving factors. No other complaint.  Past Medical History  Diagnosis Date  . Hypertension   . Obesity   . Dyslipidemia   . Allergy   . Cancer     cervix  . Encounter for routine gynecological examination     Dr. Corinna Capra  . H/O mammogram     yearly per gynecology, last one 2014   Past Surgical History  Procedure Laterality Date  . Conization cervix      4 times  . Tonsillectomy    . Wisdom tooth extraction    . Colonoscopy  2014    polyp,Dr. Verl Blalock, repeat 2018   ROS as in subjective  Objective: BP 130/82 mmHg  Pulse 72  Temp(Src) 98.3 F (36.8 C) (Oral)  Wt 366 lb 12.8 oz (166.379 kg)   General appearance: alert, no distress, WD/WN,  Neck: supple, no lymphadenopathy, no thyromegaly, no masses Heart: RRR, normal S1, S2, no murmurs Lungs: CTA bilaterally, no wheezes, rhonchi, or rales Left leg with slight asymmetrical swelling of calve region compared to right There is diffuse patches of pink/red coloration slight rough raised along bilat lower legs, worse L>R, blanchable, seems to have psoriasis appearance except without scaling.  No other purplish coloration or other warmth Tender somewhat left lateal leg Pulses 1+ pedal Neuro intact of legs   Assessment: Encounter Diagnoses   Name Primary?  . Chronic urticaria Yes  . Leg swelling   . Left leg pain     Plan: Etiology unclear.  doubt DVT, no risk factors for DVT other than obesity, and exam suggests different diagnosis.   D-dimer today just in case.  otherwise refilled clobetasol cream.   Will consider ultrasound if needed.  Will request records and labs from dermatology and labs from allergist.

## 2015-04-09 ENCOUNTER — Ambulatory Visit
Admission: RE | Admit: 2015-04-09 | Discharge: 2015-04-09 | Disposition: A | Discharge status: Home / Self Care | Payer: 59 | Source: Ambulatory Visit | Attending: Medical | Admitting: Medical

## 2015-04-09 ENCOUNTER — Other Ambulatory Visit: Payer: Self-pay | Admitting: Medical

## 2015-04-09 ENCOUNTER — Ambulatory Visit: Payer: Self-pay

## 2015-04-09 ENCOUNTER — Other Ambulatory Visit: Payer: Self-pay | Admitting: Family Medicine

## 2015-04-09 DIAGNOSIS — M7989 Other specified soft tissue disorders: Secondary | ICD-10-CM

## 2015-04-09 DIAGNOSIS — M79605 Pain in left leg: Secondary | ICD-10-CM

## 2015-04-09 DIAGNOSIS — I82402 Acute embolism and thrombosis of unspecified deep veins of left lower extremity: Secondary | ICD-10-CM

## 2015-04-09 NOTE — Progress Notes (Signed)
Patient is aware of her appointment to have a ultrasound to R/O a DVT 04/09/15 @ 255 pm [facility] [address] [place], [place]

## 2015-04-29 ENCOUNTER — Encounter: Payer: Self-pay | Admitting: Medical

## 2015-08-29 ENCOUNTER — Other Ambulatory Visit: Payer: Self-pay | Admitting: Medical

## 2015-08-29 NOTE — Telephone Encounter (Signed)
Is this ok to refill?  

## 2015-09-26 ENCOUNTER — Other Ambulatory Visit: Payer: Self-pay | Admitting: Medical

## 2015-09-30 ENCOUNTER — Encounter: Payer: Self-pay | Admitting: Family Medicine

## 2015-09-30 ENCOUNTER — Ambulatory Visit (INDEPENDENT_AMBULATORY_CARE_PROVIDER_SITE_OTHER): Payer: 59 | Admitting: Family Medicine

## 2015-09-30 VITALS — BP 150/90 | HR 88 | Ht 67.0 in | Wt 331.2 lb

## 2015-09-30 DIAGNOSIS — Z86010 Personal history of colonic polyps: Secondary | ICD-10-CM

## 2015-09-30 DIAGNOSIS — R1011 Right upper quadrant pain: Secondary | ICD-10-CM

## 2015-09-30 LAB — CBC WITH DIFFERENTIAL/PLATELET
BASOS PCT: 1 % (ref 0–1)
Basophils Absolute: 0.1 10*3/uL (ref 0.0–0.1)
Eosinophils Absolute: 0.1 10*3/uL (ref 0.0–0.7)
Eosinophils Relative: 1 % (ref 0–5)
HCT: 41.1 % (ref 36.0–46.0)
HEMOGLOBIN: 13.8 g/dL (ref 12.0–15.0)
Lymphocytes Relative: 26 % (ref 12–46)
Lymphs Abs: 1.8 10*3/uL (ref 0.7–4.0)
MCH: 32.5 pg (ref 26.0–34.0)
MCHC: 33.6 g/dL (ref 30.0–36.0)
MCV: 96.7 fL (ref 78.0–100.0)
MONO ABS: 0.6 10*3/uL (ref 0.1–1.0)
MPV: 9.4 fL (ref 8.6–12.4)
Monocytes Relative: 8 % (ref 3–12)
NEUTROS ABS: 4.5 10*3/uL (ref 1.7–7.7)
Neutrophils Relative %: 64 % (ref 43–77)
Platelets: 253 10*3/uL (ref 150–400)
RBC: 4.25 MIL/uL (ref 3.87–5.11)
RDW: 12.9 % (ref 11.5–15.5)
WBC: 7.1 10*3/uL (ref 4.0–10.5)

## 2015-09-30 LAB — COMPREHENSIVE METABOLIC PANEL
ALT: 22 U/L (ref 6–29)
AST: 18 U/L (ref 10–35)
Albumin: 4.1 g/dL (ref 3.6–5.1)
Alkaline Phosphatase: 64 U/L (ref 33–130)
BILIRUBIN TOTAL: 0.7 mg/dL (ref 0.2–1.2)
BUN: 13 mg/dL (ref 7–25)
CO2: 25 mmol/L (ref 20–31)
CREATININE: 0.91 mg/dL (ref 0.50–1.05)
Calcium: 9.4 mg/dL (ref 8.6–10.4)
Chloride: 101 mmol/L (ref 98–110)
GLUCOSE: 86 mg/dL (ref 65–99)
Potassium: 4 mmol/L (ref 3.5–5.3)
SODIUM: 136 mmol/L (ref 135–146)
Total Protein: 6.6 g/dL (ref 6.1–8.1)

## 2015-09-30 NOTE — Progress Notes (Signed)
   Subjective:    Patient ID: Christina Velasquez, female    DOB: 01-27-1966, 50 y.o.   MRN: [id number]  HPI She is here for evaluation of a one-year history of difficulty with eating and then causing right upper quadrantpain. This is also associated with bloating, gas and diarrhea.Further investigation indicates that this tends to occur more with greasy foods.Record also indicates she does have a history of colonic polyps and is due for another colonoscopy.   Review of Systems     Objective:   Physical Exam Alert and in no distress. Cardiac exam shows regular rhythm without murmurs or gallops. Lungs are clear to auscultation. Abdominal exam shows decreased bowel sounds with positive Murphy's sign but negative Murphy's punch.       Assessment & Plan:  Abdominal pain, right upper quadrant - Plan: US Abdomen Limited RUQ, CBC with Differential/Platelet, Comprehensive metabolic panel  History of colonic polyps I will pursue the right upper quadrant pain first and then make sure she gets scheduled for a follow-up colonoscopy after this is resolved. She is comfortable with that.

## 2015-10-03 ENCOUNTER — Ambulatory Visit
Admission: RE | Admit: 2015-10-03 | Discharge: 2015-10-03 | Disposition: A | Discharge status: Home / Self Care | Payer: 59 | Source: Ambulatory Visit | Attending: Family Medicine | Admitting: Family Medicine

## 2015-10-03 DIAGNOSIS — R1011 Right upper quadrant pain: Secondary | ICD-10-CM

## 2015-10-08 NOTE — Progress Notes (Signed)
Called for P.A. On 1/30 waiting on chart review which takes 2-3 days Case # [id number]

## 2015-10-11 ENCOUNTER — Other Ambulatory Visit: Payer: Self-pay | Admitting: *Deleted

## 2015-10-11 DIAGNOSIS — R1011 Right upper quadrant pain: Secondary | ICD-10-CM

## 2015-10-11 NOTE — Telephone Encounter (Signed)
Dr Redmond School did a peer to peer review & P.A. Approved [phone]  Calling to notify patient that [facility] Scan is scheduled for 2/13 @ 6:45 am  NPO after midnight [facility] X-Ray 1st floor

## 2015-10-15 ENCOUNTER — Encounter: Payer: Self-pay | Admitting: Gastroenterology

## 2015-10-21 ENCOUNTER — Ambulatory Visit (HOSPITAL_COMMUNITY)
Admission: RE | Admit: 2015-10-21 | Discharge: 2015-10-21 | Disposition: A | Discharge status: Home / Self Care | Payer: 59 | Source: Ambulatory Visit | Attending: Family Medicine | Admitting: Family Medicine

## 2015-10-21 DIAGNOSIS — R1011 Right upper quadrant pain: Secondary | ICD-10-CM | POA: Diagnosis not present

## 2015-10-21 DIAGNOSIS — R140 Abdominal distension (gaseous): Secondary | ICD-10-CM | POA: Diagnosis not present

## 2015-10-21 DIAGNOSIS — R110 Nausea: Secondary | ICD-10-CM | POA: Diagnosis not present

## 2015-10-21 MED ORDER — TECHNETIUM TC 99M MEBROFENIN IV KIT
5.2000 | PACK | Freq: Once | INTRAVENOUS | Status: AC | PRN
  Administered 2015-10-21: 5 via INTRAVENOUS

## 2015-10-21 NOTE — Progress Notes (Signed)
Left VM on preferred # to set appointment for follow up care

## 2015-10-26 ENCOUNTER — Other Ambulatory Visit: Payer: Self-pay | Admitting: Medical

## 2016-02-01 ENCOUNTER — Other Ambulatory Visit: Payer: Self-pay | Admitting: Medical

## 2016-02-05 ENCOUNTER — Telehealth: Payer: Self-pay

## 2016-02-05 MED ORDER — NEBIVOLOL HCL 5 MG PO TABS: ORAL_TABLET | ORAL | Status: DC

## 2016-02-05 NOTE — Telephone Encounter (Signed)
Refilled until appt.

## 2016-02-05 NOTE — Telephone Encounter (Signed)
Pt called in regards to denied script of bystolic. She has scheduled appt for 02/13/2016, can we give pt 30 day supply to last her until appt?   [facility] [place]

## 2016-02-12 ENCOUNTER — Encounter: Payer: Self-pay | Admitting: *Deleted

## 2016-02-13 ENCOUNTER — Ambulatory Visit (INDEPENDENT_AMBULATORY_CARE_PROVIDER_SITE_OTHER): Payer: 59 | Admitting: Medical

## 2016-02-13 ENCOUNTER — Encounter: Payer: Self-pay | Admitting: Medical

## 2016-02-13 VITALS — BP 162/108 | HR 78 | Wt 344.0 lb

## 2016-02-13 DIAGNOSIS — E785 Hyperlipidemia, unspecified: Secondary | ICD-10-CM | POA: Diagnosis not present

## 2016-02-13 DIAGNOSIS — I10 Essential (primary) hypertension: Secondary | ICD-10-CM

## 2016-02-13 DIAGNOSIS — G4700 Insomnia, unspecified: Secondary | ICD-10-CM | POA: Diagnosis not present

## 2016-02-13 DIAGNOSIS — R1010 Upper abdominal pain, unspecified: Secondary | ICD-10-CM

## 2016-02-13 LAB — COMPREHENSIVE METABOLIC PANEL
ALK PHOS: 68 U/L (ref 33–130)
ALT: 37 U/L — ABNORMAL HIGH (ref 6–29)
AST: 27 U/L (ref 10–35)
Albumin: 3.9 g/dL (ref 3.6–5.1)
BUN: 18 mg/dL (ref 7–25)
CALCIUM: 8.9 mg/dL (ref 8.6–10.4)
CO2: 26 mmol/L (ref 20–31)
Chloride: 106 mmol/L (ref 98–110)
Creat: 0.68 mg/dL (ref 0.50–1.05)
GLUCOSE: 94 mg/dL (ref 65–99)
Potassium: 3.9 mmol/L (ref 3.5–5.3)
Sodium: 141 mmol/L (ref 135–146)
Total Bilirubin: 0.7 mg/dL (ref 0.2–1.2)
Total Protein: 6.5 g/dL (ref 6.1–8.1)

## 2016-02-13 LAB — LIPID PANEL
Cholesterol: 207 mg/dL — ABNORMAL HIGH (ref 125–200)
HDL: 60 mg/dL (ref >=46)
LDL Cholesterol: 105 mg/dL (ref <130)
TRIGLYCERIDES: 210 mg/dL — AB (ref <150)
Total CHOL/HDL Ratio: 3.5 Ratio (ref <=5.0)
VLDL: 42 mg/dL — ABNORMAL HIGH (ref <30)

## 2016-02-13 LAB — HEMOGLOBIN A1C
HEMOGLOBIN A1C: 5.4 % (ref <5.7)
Mean Plasma Glucose: 108 mg/dL

## 2016-02-13 LAB — LIPASE: Lipase: 13 U/L (ref 7–60)

## 2016-02-13 MED ORDER — DEXLANSOPRAZOLE 60 MG PO CPDR: 60.0000 mg | DELAYED_RELEASE_CAPSULE | Freq: Every day | ORAL | Status: DC

## 2016-02-13 MED ORDER — NEBIVOLOL HCL 5 MG PO TABS: ORAL_TABLET | ORAL | Status: DC

## 2016-02-13 MED ORDER — LOSARTAN POTASSIUM-HCTZ 50-12.5 MG PO TABS: 1.0000 | ORAL_TABLET | Freq: Every day | ORAL | Status: DC

## 2016-02-13 MED ORDER — ALPRAZOLAM 0.5 MG PO TABS: ORAL_TABLET | ORAL | Status: DC

## 2016-02-13 MED ORDER — ASPIRIN EC 81 MG PO TBEC: 81.0000 mg | DELAYED_RELEASE_TABLET | Freq: Every day | ORAL | Status: DC

## 2016-02-13 NOTE — Progress Notes (Signed)
Subjective: Chief Complaint  Patient presents with  . med check    fasting, no problems or concerns   Here for routine med check.   Has a hx/o hypertension, dyslipidemia, allergic rhinitis, chronic urticaria, obesity.   Hypertension - compliant with bystolic 5mg  daily, HCTZ 25mg  daily.  dyslipidemia - not current taking medication.  Forgets to take at night time.  Still has her old body. She is fasting today.  Exercise - swims some.  Limiting salt, not eating a lot of fast food.    Eats late at times, has busy work schedule.  Saw Dr. Redmond School in January for abdominal pain, upper, had negative [place] and HIDA other than fatty liver.   Still having regular pain, can feel pain right after eating.  Has trouble getting to sleep, mind wont shut off. The only think she has ever had is xanax a few times and it helped her get to sleep    Past Medical History  Diagnosis Date  . Hypertension   . Obesity   . Dyslipidemia   . Allergy   . Cancer ([place])     cervix  . Encounter for routine gynecological examination     Dr. Corinna Capra  . H/O mammogram     yearly per gynecology, last one 2014  . Colon polyp 03/29/2011    sessile polyp    Current Outpatient Prescriptions on File Prior to Visit  Medication Sig Dispense Refill  . aspirin 325 MG tablet Take 325 mg by mouth daily.      . cetirizine (ZYRTEC) 10 MG tablet Take 10 mg by mouth 4 (four) times daily as needed for allergies.    . hydrochlorothiazide (HYDRODIURIL) 25 MG tablet TAKE 1 TABLET (25 MG TOTAL) BY MOUTH DAILY. 30 tablet 5  . Multiple Vitamins-Minerals (MULTIVITAMIN WITH MINERALS) tablet Take 1 tablet by mouth daily.      . nebivolol (BYSTOLIC) 5 MG tablet TAKE 1 TABLET (5 MG TOTAL) BY MOUTH DAILY. 30 tablet 0  . albuterol (PROVENTIL HFA;VENTOLIN HFA) 108 (90 BASE) MCG/ACT inhaler Inhale 2 puffs into the lungs every 6 (six) hours as needed for wheezing or shortness of breath. (Patient not taking: Reported on 09/30/2015) 1 Inhaler 0  .  clobetasol cream (TEMOVATE) [id number] % Apply 1 application topically 2 (two) times daily. Use for 1-2 weeks at a time, avoid genitals and face (Patient not taking: Reported on 02/13/2016) 120 g 0  . EPINEPHrine (EPI-PEN) 0.3 mg/0.3 mL SOAJ injection Inject 0.3 mLs (0.3 mg total) into the muscle once. (Patient not taking: Reported on 02/13/2016) 1 Device 0   No current facility-administered medications on file prior to visit.   Past Surgical History  Procedure Laterality Date  . Conization cervix      4 times  . Tonsillectomy    . Wisdom tooth extraction    . Colonoscopy  2014    polyp,Dr. Verl Blalock, repeat 2018    ROS as in subjective   Objective: BP 162/108 mmHg  Pulse 78  Wt 344 lb (156.037 kg)  LMP 10/21/2015  BP Readings from Last 3 Encounters:  02/13/16 162/108  09/30/15 150/90  04/08/15 130/82   Wt Readings from Last 3 Encounters:  02/13/16 344 lb (156.037 kg)  09/30/15 331 lb 3.2 oz (150.231 kg)  04/08/15 366 lb 12.8 oz (166.379 kg)   General appearance: alert, no distress, WD/WN, obese white female Oral cavity: MMM, no lesions Neck: supple, no lymphadenopathy, no thyromegaly, no masses, no bruits Heart: RRR, normal S1,  S2, no murmurs Lungs: CTA bilaterally, no wheezes, rhonchi, or rales Abdomen: +bs, soft, non tender, non distended, no masses, no hepatomegaly, no splenomegaly Pulses: 2+ symmetric, upper and lower extremities, normal cap refill Ext: 1+ nonpitting edema of ankles   Adult ECG Report  Indication: elevated BP  Rate: 69 bpm  Rhythm: normal sinus rhythm  QRS Axis: 17 degrees  PR Interval: 149ms  QRS Duration: 51ms  QTc: 49ms  Conduction Disturbances: none  Other Abnormalities: none  Patient's cardiac risk factors are: dyslipidemia, hypertension and obesity (BMI >= 30 kg/m2).  EKG comparison: none  Narrative Interpretation: baseline disturbance but otherwise normal EKG    Assessment: Encounter Diagnoses  Name Primary?  . Essential  hypertension Yes  . Hyperlipidemia   . Obesity, Class III, BMI 40-49.9 (morbid obesity) ([place])   . Pain of upper abdomen   . Insomnia     Plan: HTN - reviewed EKG.  C/t bystolic 5mg  daily, change from HCTZ to Losartan HCT.  discussed risks/benefits of medication, avoid salt, discussed diet and exercise.    Hyperlipidemia - labs today  Obesity - needs to work on lifestyle changes  abdominal pain - reviewed 09/2015 [place] and HIDA scan.  Begin Dexilant, avoid GERD triggers, f/u 75mo  insomnia - begin trial of Xanax  F/u pending labs.  Christina Velasquez was seen today for med check.  Diagnoses and all orders for this visit:  Essential hypertension -     Comprehensive metabolic panel -     Lipid panel -     EKG 12-Lead -     Hemoglobin A1c -     Lipase  Hyperlipidemia -     Hemoglobin A1c  Obesity, Class III, BMI 40-49.9 (morbid obesity) (H[facility]) -     Hemoglobin A1c  Pain of upper abdomen -     Lipase  Insomnia  Other orders -     nebivolol (BYSTOLIC) 5 MG tablet; TAKE 1 TABLET (5 MG TOTAL) BY MOUTH DAILY. -     aspirin EC 81 MG tablet; Take 1 tablet (81 mg total) by mouth daily. -     losartan-hydrochlorothiazide (HYZAAR) 50-12.5 MG tablet; Take 1 tablet by mouth daily. -     ALPRAZolam (XANAX) 0.5 MG tablet; 1/2- 1 tablet po prn QHS for sleep -     dexlansoprazole (DEXILANT) 60 MG capsule; Take 1 capsule (60 mg total) by mouth daily.

## 2016-03-04 ENCOUNTER — Other Ambulatory Visit: Payer: Self-pay | Admitting: Medical

## 2016-03-06 ENCOUNTER — Encounter: Payer: Self-pay | Admitting: Gastroenterology

## 2016-03-18 ENCOUNTER — Ambulatory Visit: Payer: 59 | Admitting: Medical

## 2016-07-12 ENCOUNTER — Other Ambulatory Visit: Payer: Self-pay | Admitting: Medical

## 2016-10-16 ENCOUNTER — Other Ambulatory Visit: Payer: Self-pay | Admitting: Medical

## 2017-01-13 ENCOUNTER — Other Ambulatory Visit: Payer: Self-pay | Admitting: Medical

## 2017-01-13 NOTE — Telephone Encounter (Signed)
Called and l/m for pt call us back to make an appt for refills.

## 2017-01-15 ENCOUNTER — Telehealth: Payer: Self-pay | Admitting: Family Medicine

## 2017-01-15 ENCOUNTER — Encounter: Payer: Self-pay | Admitting: Family Medicine

## 2017-01-15 ENCOUNTER — Other Ambulatory Visit: Payer: Self-pay

## 2017-01-15 MED ORDER — LOSARTAN POTASSIUM-HCTZ 50-12.5 MG PO TABS: 1.0000 | ORAL_TABLET | Freq: Every day | ORAL | 0 refills | Status: DC

## 2017-01-15 NOTE — Telephone Encounter (Signed)
Sent refill to pharmacy. 

## 2017-01-15 NOTE — Telephone Encounter (Signed)
Pt made [facility] appt for 02/10/17. Requesting refill on Losartan to last until this appt

## 2017-01-27 IMAGING — NM NM HEPATO W/GB/PHARM/[PERSON_NAME]
2 series · 12 of 12 positions shown · non-contrast
Comparison: None.

CLINICAL DATA: Right upper quadrant pain, bloating/gas, fatty food
intolerance, nausea. Symptoms have lasted x1 year.

EXAM:
NUCLEAR MEDICINE HEPATOBILIARY IMAGING WITH GALLBLADDER EF
TECHNIQUE: Sequential images of the abdomen were obtained [DATE] minutes
following intravenous administration of radiopharmaceutical. After
oral ingestion of Ensure, gallbladder ejection fraction was
determined. At 60 min, normal ejection fraction is greater than 33%.
RADIOPHARMACEUTICALS:  5.2 mCi Ic-BBm  Choletec IV

[Series 1: biliary · 4.14mm/px · 6 of 60 frames shown]
[frame 6/60]
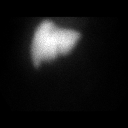
[frame 16/60]
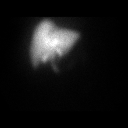
[frame 26/60]
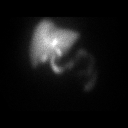
[frame 36/60]
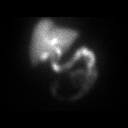
[frame 46/60]
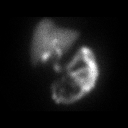
[frame 56/60]
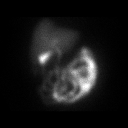

[Series 3: gbef · 4.14mm/px · 6 of 60 frames shown]
[frame 6/60]
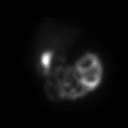
[frame 16/60]
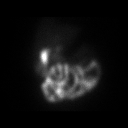
[frame 26/60]
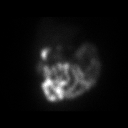
[frame 36/60]
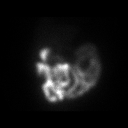
[frame 46/60]
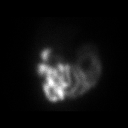
[frame 56/60]
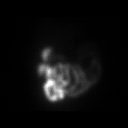

[12 of 12 positions shown; findings below may reference images not displayed]

FINDINGS: Normal appendix excretion.

Gallbladder is visualized at 10-15 minutes.

Small bowel is visualized 15 minutes.

Gallbladder ejection fraction: 69%. Normal gallbladder ejection
fraction with Ensure is greater than 33%.
IMPRESSION: Normal gallbladder ejection fraction.

## 2017-02-10 ENCOUNTER — Encounter: Payer: Self-pay | Admitting: Medical

## 2017-02-10 ENCOUNTER — Ambulatory Visit (INDEPENDENT_AMBULATORY_CARE_PROVIDER_SITE_OTHER): Payer: 59 | Admitting: Medical

## 2017-02-10 VITALS — BP 126/80 | HR 79 | Wt 351.6 lb

## 2017-02-10 DIAGNOSIS — L508 Other urticaria: Secondary | ICD-10-CM

## 2017-02-10 DIAGNOSIS — E785 Hyperlipidemia, unspecified: Secondary | ICD-10-CM | POA: Diagnosis not present

## 2017-02-10 DIAGNOSIS — R609 Edema, unspecified: Secondary | ICD-10-CM

## 2017-02-10 DIAGNOSIS — I10 Essential (primary) hypertension: Secondary | ICD-10-CM | POA: Diagnosis not present

## 2017-02-10 DIAGNOSIS — J309 Allergic rhinitis, unspecified: Secondary | ICD-10-CM

## 2017-02-10 DIAGNOSIS — Z800 Family history of malignant neoplasm of digestive organs: Secondary | ICD-10-CM | POA: Diagnosis not present

## 2017-02-10 DIAGNOSIS — Z86010 Personal history of colonic polyps: Secondary | ICD-10-CM | POA: Diagnosis not present

## 2017-02-10 DIAGNOSIS — I872 Venous insufficiency (chronic) (peripheral): Secondary | ICD-10-CM | POA: Diagnosis not present

## 2017-02-10 LAB — CBC
HCT: 40.4 % (ref 35.0–45.0)
Hemoglobin: 13.5 g/dL (ref 11.7–15.5)
MCH: 32.5 pg (ref 27.0–33.0)
MCHC: 33.4 g/dL (ref 32.0–36.0)
MCV: 97.3 fL (ref 80.0–100.0)
MPV: 10 fL (ref 7.5–12.5)
PLATELETS: 228 10*3/uL (ref 140–400)
RBC: 4.15 MIL/uL (ref 3.80–5.10)
RDW: 13.4 % (ref 11.0–15.0)
WBC: 3.7 10*3/uL — AB (ref 4.0–10.5)

## 2017-02-10 LAB — LIPID PANEL
CHOLESTEROL: 216 mg/dL — AB (ref <200)
HDL: 57 mg/dL (ref >50)
LDL Cholesterol: 128 mg/dL — ABNORMAL HIGH (ref <100)
Total CHOL/HDL Ratio: 3.8 Ratio (ref <5.0)
Triglycerides: 157 mg/dL — ABNORMAL HIGH (ref <150)
VLDL: 31 mg/dL — ABNORMAL HIGH (ref <30)

## 2017-02-10 LAB — COMPREHENSIVE METABOLIC PANEL
ALK PHOS: 68 U/L (ref 33–130)
ALT: 33 U/L — AB (ref 6–29)
AST: 25 U/L (ref 10–35)
Albumin: 4 g/dL (ref 3.6–5.1)
BUN: 16 mg/dL (ref 7–25)
CO2: 25 mmol/L (ref 20–31)
CREATININE: 0.72 mg/dL (ref 0.50–1.05)
Calcium: 9.2 mg/dL (ref 8.6–10.4)
Chloride: 105 mmol/L (ref 98–110)
Glucose, Bld: 103 mg/dL — ABNORMAL HIGH (ref 65–99)
Potassium: 4.1 mmol/L (ref 3.5–5.3)
SODIUM: 138 mmol/L (ref 135–146)
TOTAL PROTEIN: 6.7 g/dL (ref 6.1–8.1)
Total Bilirubin: 0.8 mg/dL (ref 0.2–1.2)

## 2017-02-10 MED ORDER — ASPIRIN EC 81 MG PO TBEC: 81.0000 mg | DELAYED_RELEASE_TABLET | Freq: Every day | ORAL | 3 refills | Status: DC

## 2017-02-10 MED ORDER — ALPRAZOLAM 0.5 MG PO TABS: ORAL_TABLET | ORAL | 2 refills | Status: DC

## 2017-02-10 MED ORDER — NEBIVOLOL HCL 5 MG PO TABS: ORAL_TABLET | ORAL | 3 refills | Status: DC
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
## 2017-02-11 ENCOUNTER — Other Ambulatory Visit: Payer: Self-pay | Admitting: Medical

## 2017-02-11 LAB — HEMOGLOBIN A1C
Hgb A1c MFr Bld: 5.2 %
Mean Plasma Glucose: 103 mg/dL

## 2017-02-11 LAB — TSH: TSH: 3.29 m[IU]/L

## 2017-02-11 MED ORDER — FUROSEMIDE 20 MG PO TABS: 20.0000 mg | ORAL_TABLET | Freq: Every day | ORAL | 1 refills | Status: DC

## 2017-02-11 MED ORDER — LOSARTAN POTASSIUM 50 MG PO TABS: 50.0000 mg | ORAL_TABLET | Freq: Every day | ORAL | 3 refills | Status: DC

## 2017-02-11 MED ORDER — PRAVASTATIN SODIUM 20 MG PO TABS: 20.0000 mg | ORAL_TABLET | Freq: Every evening | ORAL | 0 refills | Status: DC

## 2017-02-12 ENCOUNTER — Other Ambulatory Visit: Payer: Self-pay | Admitting: Medical

## 2017-04-07 ENCOUNTER — Other Ambulatory Visit: Payer: Self-pay | Admitting: Medical

## 2017-05-08 ENCOUNTER — Other Ambulatory Visit: Payer: Self-pay | Admitting: Medical

## 2017-06-07 ENCOUNTER — Other Ambulatory Visit: Payer: Self-pay | Admitting: Medical

## 2017-07-08 ENCOUNTER — Other Ambulatory Visit: Payer: Self-pay | Admitting: Medical

## 2017-07-28 ENCOUNTER — Other Ambulatory Visit: Payer: Self-pay | Admitting: Medical

## 2017-08-07 ENCOUNTER — Other Ambulatory Visit: Payer: Self-pay | Admitting: Medical

## 2017-09-08 ENCOUNTER — Other Ambulatory Visit: Payer: Self-pay | Admitting: Medical

## 2017-10-08 ENCOUNTER — Other Ambulatory Visit: Payer: Self-pay

## 2017-10-08 ENCOUNTER — Telehealth: Payer: Self-pay | Admitting: Medical

## 2017-10-08 MED ORDER — FUROSEMIDE 20 MG PO TABS: 20.0000 mg | ORAL_TABLET | Freq: Every day | ORAL | 0 refills | Status: DC

## 2017-10-08 NOTE — Telephone Encounter (Signed)
Sent in refill to pharmacy.

## 2017-10-08 NOTE — Telephone Encounter (Signed)
Pt called and made an appt for a medcheck. She will be out of furosemide before her appt. Please send refill to [facility].

## 2017-10-09 ENCOUNTER — Other Ambulatory Visit: Payer: Self-pay | Admitting: Medical

## 2017-10-13 ENCOUNTER — Telehealth: Payer: Self-pay

## 2017-10-13 NOTE — Telephone Encounter (Signed)
Pt called because her lasix was out.  Chart shows thirty day supply sent in on 10-08-17. Pt has a upcoming appt 10-21-17 [facility]

## 2017-10-21 ENCOUNTER — Encounter: Payer: Self-pay | Admitting: Medical

## 2017-10-21 ENCOUNTER — Ambulatory Visit: Payer: 59 | Admitting: Medical

## 2017-10-21 VITALS — BP 140/88 | HR 71 | Wt 348.6 lb

## 2017-10-21 DIAGNOSIS — R609 Edema, unspecified: Secondary | ICD-10-CM | POA: Diagnosis not present

## 2017-10-21 DIAGNOSIS — E785 Hyperlipidemia, unspecified: Secondary | ICD-10-CM

## 2017-10-21 DIAGNOSIS — G5603 Carpal tunnel syndrome, bilateral upper limbs: Secondary | ICD-10-CM | POA: Diagnosis not present

## 2017-10-21 DIAGNOSIS — I872 Venous insufficiency (chronic) (peripheral): Secondary | ICD-10-CM | POA: Diagnosis not present

## 2017-10-21 DIAGNOSIS — I10 Essential (primary) hypertension: Secondary | ICD-10-CM | POA: Diagnosis not present

## 2017-10-21 DIAGNOSIS — Z636 Dependent relative needing care at home: Secondary | ICD-10-CM | POA: Diagnosis not present

## 2017-10-21 NOTE — Progress Notes (Signed)
Subjective: Chief Complaint  Patient presents with  . med check    med check    Here for med check.    HTN - compliant with medications for blood pressure.     hyperlipidemia - didn't tolerate statin from last visit, upset her stomach.      Having headaches somewhat frequently, calls them stress headaches.   But also has allergy problems.  Headache are late in the day.  headaches typically frontal.   Vision has gotten worse over the years.  Driving a lot more for work, getting some hand numbness.  But if she stays moving, she is fine.  Worse sitting driving in the care.  Both hands get numb, typically 2nd and 3rd fingers.  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  She feels like she is always under stress.  She is still helping take care of her daughter who has been addicted to heroin.  Her daughter will not commit to a residential treatment facility but she is willing to do the methadone clinic.  So currently they are going there every day for group therapy and drug testing and treatment.  This takes her an extra 4 hours out of her day to help care for her daughter.     Past Medical History:  Diagnosis Date  . Allergy   . Cancer ([place])    cervix  . Colon polyp 03/29/2011   sessile polyp  . Dyslipidemia   . Encounter for routine gynecological examination    Dr. Corinna Capra  . H/O mammogram    yearly per gynecology, last one 2014  . Hypertension   . Obesity    Current Outpatient Medications on File Prior to Visit  Medication Sig Dispense Refill  . albuterol (PROVENTIL HFA;VENTOLIN HFA) 108 (90 BASE) MCG/ACT inhaler Inhale 2 puffs into the lungs every 6 (six) hours as needed for wheezing or shortness of breath. 1 Inhaler 0  . aspirin EC 81 MG tablet Take 1 tablet (81 mg total) by mouth daily. 90 tablet 3  . BYSTOLIC 5 MG tablet TAKE 1 TABLET (5 MG TOTAL) BY MOUTH DAILY. 90 tablet 1  . cetirizine  (ZYRTEC) 10 MG tablet Take 10 mg by mouth 4 (four) times daily as needed for allergies.    Marland Kitchen EPINEPHrine (EPI-PEN) 0.3 mg/0.3 mL SOAJ injection Inject 0.3 mLs (0.3 mg total) into the muscle once. 1 Device 0  . furosemide (LASIX) 20 MG tablet Take 1 tablet (20 mg total) by mouth daily. 30 tablet 0  . losartan (COZAAR) 50 MG tablet Take 1 tablet (50 mg total) by mouth daily. 90 tablet 3  . Multiple Vitamins-Minerals (MULTIVITAMIN WITH MINERALS) tablet Take 1 tablet by mouth daily.      . pravastatin (PRAVACHOL) 20 MG tablet TAKE 1 TABLET BY MOUTH EVERY DAY IN THE EVENING (Patient not taking: Reported on 10/21/2017) 90 tablet 0   No current facility-administered medications on file prior to visit.    ROS as in subjective    Objective: BP 140/88   Pulse 71   Wt (!) 348 lb 9.6 oz (158.1 kg)   SpO2 98%   BMI 54.60 kg/m   Wt Readings from Last 3 Encounters:  10/21/17 (!) 348 lb 9.6 oz (158.1 kg)  02/10/17 (!) 351 lb 9.6 oz (159.5 kg)  02/13/16 (!) 344 lb (156 kg)   BP Readings from Last 3 Encounters:  10/21/17 140/88  02/10/17 126/80  02/13/16 (!) 162/108   General appearance: alert, no distress, WD/WN,  Neck: supple, no lymphadenopathy, no thyromegaly, no masses, no bruits Heart: RRR, normal S1, S2, no murmurs Lungs: CTA bilaterally, no wheezes, rhonchi, or rales Pulses: 2+ symmetric, upper and lower extremities, normal cap refill Ext: no edema No hand atrophy, hands and arms nontender, no swelling Neuro: negative tinels and phalens.    Assessment: Encounter Diagnoses  Name Primary?  . Essential hypertension Yes  . Hyperlipidemia, unspecified hyperlipidemia type   . Edema, unspecified type   . Chronic venous insufficiency   . Bilateral carpal tunnel syndrome   . Caregiver burden      Plan HTN - c/t same medication  hyperlipidemia - restart statin, but take 1/2 tablet every other day to see if she tolerates this.  Recheck 6-8 wk fastin for nurse visit labs  Edema -  c/t lasix  CTS - advised night time splints, NSAID prn, and if not much improved in the next few weeks, then recheck.  Caregiver burden - encouraged her to make time for exercise several days per week, needs to spend some time focusing on her health too.  Sharlynn was seen today for med check.  Diagnoses and all orders for this visit:  Essential hypertension -     Lipid panel; Future -     Comprehensive metabolic panel; Future  Hyperlipidemia, unspecified hyperlipidemia type -     Lipid panel; Future -     Comprehensive metabolic panel; Future  Edema, unspecified type  Chronic venous insufficiency  Bilateral carpal tunnel syndrome  Caregiver burden

## 2017-11-05 ENCOUNTER — Other Ambulatory Visit: Payer: Self-pay | Admitting: Medical

## 2017-12-04 ENCOUNTER — Other Ambulatory Visit: Payer: Self-pay | Admitting: Medical

## 2017-12-11 ENCOUNTER — Other Ambulatory Visit: Payer: Self-pay | Admitting: Medical

## 2018-01-04 ENCOUNTER — Other Ambulatory Visit: Payer: Self-pay | Admitting: Medical

## 2018-01-29 ENCOUNTER — Other Ambulatory Visit: Payer: Self-pay | Admitting: Medical

## 2018-02-08 ENCOUNTER — Other Ambulatory Visit: Payer: Self-pay | Admitting: Medical

## 2018-02-08 NOTE — Telephone Encounter (Signed)
Left message on voicemail for patient to call to make a fasting lab appointment that was supposed to be done 6-8 weeks after her visit in February and she never came in for before we will be able to refill medications.

## 2018-02-13 ENCOUNTER — Other Ambulatory Visit: Payer: Self-pay | Admitting: Medical

## 2018-02-23 ENCOUNTER — Other Ambulatory Visit: Payer: Self-pay | Admitting: Medical

## 2018-02-25 ENCOUNTER — Telehealth: Payer: Self-pay

## 2018-02-25 NOTE — Telephone Encounter (Signed)
Left message on voicemail for patient that her Lasix has been sent in to [facility] in [place] on 02-24-18.  Pharmacy states that patient's insurance will not pay for it until 02-28-18.

## 2018-03-03 ENCOUNTER — Ambulatory Visit: Payer: 59 | Admitting: Medical

## 2018-03-03 VITALS — BP 160/100 | HR 76 | Temp 98.0°F | Resp 18 | Ht 68.5 in | Wt 363.8 lb

## 2018-03-03 DIAGNOSIS — I872 Venous insufficiency (chronic) (peripheral): Secondary | ICD-10-CM | POA: Diagnosis not present

## 2018-03-03 DIAGNOSIS — R609 Edema, unspecified: Secondary | ICD-10-CM | POA: Diagnosis not present

## 2018-03-03 DIAGNOSIS — E785 Hyperlipidemia, unspecified: Secondary | ICD-10-CM | POA: Diagnosis not present

## 2018-03-03 DIAGNOSIS — Z86010 Personal history of colonic polyps: Secondary | ICD-10-CM | POA: Diagnosis not present

## 2018-03-03 DIAGNOSIS — I10 Essential (primary) hypertension: Secondary | ICD-10-CM

## 2018-03-03 LAB — COMPREHENSIVE METABOLIC PANEL
ALT: 44 IU/L — ABNORMAL HIGH (ref 0–32)
AST: 28 IU/L (ref 0–40)
Albumin/Globulin Ratio: 1.7 (ref 1.2–2.2)
Albumin: 4.3 g/dL (ref 3.5–5.5)
Alkaline Phosphatase: 89 IU/L (ref 39–117)
BUN/Creatinine Ratio: 16 (ref 9–23)
BUN: 13 mg/dL (ref 6–24)
Bilirubin Total: 0.5 mg/dL (ref 0.0–1.2)
CALCIUM: 9.3 mg/dL (ref 8.7–10.2)
CO2: 23 mmol/L (ref 20–29)
CREATININE: 0.79 mg/dL (ref 0.57–1.00)
Chloride: 105 mmol/L (ref 96–106)
GFR, EST AFRICAN AMERICAN: 99 mL/min/{1.73_m2} (ref >59)
GFR, EST NON AFRICAN AMERICAN: 86 mL/min/{1.73_m2} (ref >59)
GLOBULIN, TOTAL: 2.6 g/dL (ref 1.5–4.5)
Glucose: 102 mg/dL — ABNORMAL HIGH (ref 65–99)
Potassium: 4.3 mmol/L (ref 3.5–5.2)
Sodium: 143 mmol/L (ref 134–144)
TOTAL PROTEIN: 6.9 g/dL (ref 6.0–8.5)

## 2018-03-03 LAB — LIPID PANEL
CHOL/HDL RATIO: 3.5 ratio (ref 0.0–4.4)
Cholesterol, Total: 226 mg/dL — ABNORMAL HIGH (ref 100–199)
HDL: 64 mg/dL (ref >39)
LDL CALC: 139 mg/dL — AB (ref 0–99)
TRIGLYCERIDES: 116 mg/dL (ref 0–149)
VLDL Cholesterol Cal: 23 mg/dL (ref 5–40)

## 2018-03-03 MED ORDER — NEBIVOLOL HCL 10 MG PO TABS: 10.0000 mg | ORAL_TABLET | Freq: Every day | ORAL | 1 refills | Status: DC

## 2018-03-03 MED ORDER — FUROSEMIDE 20 MG PO TABS: 20.0000 mg | ORAL_TABLET | Freq: Every day | ORAL | 1 refills | Status: DC

## 2018-03-03 MED ORDER — LOSARTAN POTASSIUM 100 MG PO TABS
100.0000 mg | ORAL_TABLET | Freq: Every day | ORAL | 1 refills | Status: DC
Start: 2018-03-03 — End: 2018-08-15

## 2018-03-03 NOTE — Progress Notes (Signed)
Subjective: Chief Complaint  Patient presents with  . med check    med check   fasting   Here for med check.    HTN - compliant with medications for blood pressure.   She has her cuff that shows mostly good readings but some high readings  hyperlipidemia - compliant with statin since last visit  She knows she is due for colonoscopy  She notes that she has been extremely busy this past year, but after September 2019 she will be able to take a break. Is planning to take a month off work and get caught up on things   Her daughter with addiction issues is doing well of late, clean and sober.    She had recent sinus issues, tooth infection, still has some right ear discomfort.  Past Medical History:  Diagnosis Date  . Allergy   . Cancer ([place])    cervix  . Colon polyp 03/29/2011   sessile polyp  . Dyslipidemia   . Encounter for routine gynecological examination    Dr. Corinna Capra  . H/O mammogram    yearly per gynecology, last one 2014  . Hypertension   . Obesity    Current Outpatient Medications on File Prior to Visit  Medication Sig Dispense Refill  . albuterol (PROVENTIL HFA;VENTOLIN HFA) 108 (90 BASE) MCG/ACT inhaler Inhale 2 puffs into the lungs every 6 (six) hours as needed for wheezing or shortness of breath. 1 Inhaler 0  . aspirin 81 MG EC tablet TAKE 1 TABLET BY MOUTH EVERY DAY 90 tablet 2  . cetirizine (ZYRTEC) 10 MG tablet Take 10 mg by mouth 4 (four) times daily as needed for allergies.    Marland Kitchen EPINEPHrine (EPI-PEN) 0.3 mg/0.3 mL SOAJ injection Inject 0.3 mLs (0.3 mg total) into the muscle once. 1 Device 0  . Multiple Vitamins-Minerals (MULTIVITAMIN WITH MINERALS) tablet Take 1 tablet by mouth daily.      . pravastatin (PRAVACHOL) 20 MG tablet TAKE 1 TABLET BY MOUTH EVERY DAY IN THE EVENING 30 tablet 0   No current facility-administered medications on file prior to visit.    ROS as in subjective    Objective: BP (!) 160/100   Pulse 76   Temp 98 F (36.7 C) (Oral)    Resp 18   Ht 5' 8.5" (1.74 m)   Wt (!) 363 lb 12.8 oz (165 kg)   SpO2 98%   BMI 54.51 kg/m   Wt Readings from Last 3 Encounters:  03/03/18 (!) 363 lb 12.8 oz (165 kg)  10/21/17 (!) 348 lb 9.6 oz (158.1 kg)  02/10/17 (!) 351 lb 9.6 oz (159.5 kg)   BP Readings from Last 3 Encounters:  03/03/18 (!) 160/100  10/21/17 140/88  02/10/17 126/80   General appearance: alert, no distress, WD/WN,  Neck: supple, no lymphadenopathy, no thyromegaly, no masses, no bruits Heart: RRR, normal S1, S2, no murmurs Lungs: CTA bilaterally, no wheezes, rhonchi, or rales Pulses: 1+ symmetric, upper and lower extremities, normal cap refill Ext: bilat 1+ nonpitting LE edema with reddish color changes from venous insufficiency    Assessment: Encounter Diagnoses  Name Primary?  . Essential hypertension Yes  . Chronic venous insufficiency   . Hyperlipidemia, unspecified hyperlipidemia type   . History of colonic polyps   . Edema, unspecified type      Plan HTN -   Repeat BPs with our cuff large cuff L 156/100, R 150/100, her cuff wrist cuff 142/94 R, 149/89 L.    hyperlipidemia -lab today  since she has been compliant with statin  Edema - c/t lasix  Glad to hear her daughter is doing better  She will plan colonoscopy after 05/2018  counseled on making her health a priority.  Patient Instructions  Recommendations  Please continue plan to get you in for consult for colonoscopy in the fall  Get your swimming pool repaired so you can start swimming  We will call with lab results  Work on improving your health through exercise and healthy diet  Regarding blood pressure, increase Bystolic to 10 mg daily, and increase the losartan as well to 100 mg daily  Continue Lasix as is  I will send refil    Alvah was seen today for med check.  Diagnoses and all orders for this visit:  Essential hypertension -     Comprehensive metabolic panel -     Lipid panel  Chronic venous  insufficiency  Hyperlipidemia, unspecified hyperlipidemia type -     Comprehensive metabolic panel -     Lipid panel  History of colonic polyps  Edema, unspecified type  Other orders -     furosemide (LASIX) 20 MG tablet; Take 1 tablet (20 mg total) by mouth daily. -     nebivolol (BYSTOLIC) 10 MG tablet; Take 1 tablet (10 mg total) by mouth daily. -     losartan (COZAAR) 100 MG tablet; Take 1 tablet (100 mg total) by mouth daily.

## 2018-03-03 NOTE — Patient Instructions (Addendum)
Recommendations  Please continue plan to get you in for consult for colonoscopy in the fall  Get your swimming pool repaired so you can start swimming  We will call with lab results  Work on improving your health through exercise and healthy diet  Regarding blood pressure, increase Bystolic to 10 mg daily, and increase the losartan as well to 100 mg daily  Continue Lasix as is  I will send refil

## 2018-03-04 ENCOUNTER — Other Ambulatory Visit: Payer: Self-pay | Admitting: Medical

## 2018-03-04 DIAGNOSIS — R748 Abnormal levels of other serum enzymes: Secondary | ICD-10-CM

## 2018-03-04 MED ORDER — ROSUVASTATIN CALCIUM 20 MG PO TABS: 20.0000 mg | ORAL_TABLET | Freq: Every day | ORAL | 3 refills | Status: DC

## 2018-05-14 ENCOUNTER — Telehealth: Payer: Self-pay | Admitting: Medical

## 2018-05-14 NOTE — Telephone Encounter (Signed)
P.A. Victoriano Lain

## 2018-05-23 ENCOUNTER — Other Ambulatory Visit: Payer: Self-pay | Admitting: Medical

## 2018-05-23 MED ORDER — METOPROLOL TARTRATE 50 MG PO TABS: 50.0000 mg | ORAL_TABLET | Freq: Two times a day (BID) | ORAL | 2 refills | Status: DC

## 2018-05-23 NOTE — Telephone Encounter (Signed)
Let her know about the denial.   Change to Metoprolol twice daily when she runs out of bystolic

## 2018-05-23 NOTE — Telephone Encounter (Signed)
P.A. Victoriano Lain denied, pt needs trial of 2 alternatives, Atenolol, Carvedilol, Metoprolol.  Do you want to switch?

## 2018-05-24 ENCOUNTER — Encounter: Payer: Self-pay | Admitting: Medical

## 2018-05-24 NOTE — Telephone Encounter (Signed)
Pt informed of medication change and denial.  She was very upset and doesn't want to change medications.  She states she tried multiple meds in the past and had allergic reactions and ended up having to go to dermatologist and allergists due to skin issues and would like [place] to do an appeal to try and get this covered.  I advised would send in appeal.  She will also contact [place] and see what she can do.

## 2018-06-02 NOTE — Telephone Encounter (Signed)
Pt called & she wants to just pay out of pocket for Bystolic until we hear back from the appeal.

## 2018-07-07 NOTE — Telephone Encounter (Signed)
Recv'd denial from appeal, pt must try 2 of preferred alternatives Atenolol, Carvedelol or Metoprolol before this can be approved.  Called pharmacy and tried [employer] fashion[employer]clothing discount card and it would not work because [employer] will not cover and tried Good Rx discount card and cost is $157 and pt has been paying $159 with store discount.   So still very expensive.  Called pt and explained appeal was denied.  She is willing to try one of the preferred.  Christina Velasquez which would you prefer of these 3 that would have the least likelihood to cause an allergic reaction or skin issue? Please send into pharmacy

## 2018-07-08 ENCOUNTER — Other Ambulatory Visit: Payer: Self-pay | Admitting: Medical

## 2018-07-08 NOTE — Telephone Encounter (Signed)
I thought we changed her to metoprolol back in September for this reason already.  Metoprolol should be very cheap.

## 2018-07-13 NOTE — Telephone Encounter (Signed)
Pt informed

## 2018-08-12 ENCOUNTER — Telehealth: Payer: Self-pay

## 2018-08-12 NOTE — Telephone Encounter (Signed)
Patient lmom stating she called pharmacy to get a refill on Losartan and she was told they could not fill it. Is this a medication patient should still be taking?

## 2018-08-12 NOTE — Telephone Encounter (Signed)
See previous message

## 2018-08-15 ENCOUNTER — Other Ambulatory Visit: Payer: Self-pay | Admitting: Medical

## 2018-08-15 MED ORDER — METOPROLOL TARTRATE 50 MG PO TABS: 50.0000 mg | ORAL_TABLET | Freq: Two times a day (BID) | ORAL | 0 refills | Status: DC

## 2018-08-15 MED ORDER — LOSARTAN POTASSIUM 100 MG PO TABS: 100.0000 mg | ORAL_TABLET | Freq: Every day | ORAL | 0 refills | Status: DC

## 2018-08-15 MED ORDER — FUROSEMIDE 20 MG PO TABS: 20.0000 mg | ORAL_TABLET | Freq: Every day | ORAL | 1 refills | Status: DC

## 2018-08-15 NOTE — Telephone Encounter (Signed)
Left message on voicemail that RX has been refilled and sent to her pharmacy.

## 2018-08-15 NOTE — Telephone Encounter (Signed)
She should be on losartan and metoprolol, and I just refilled both

## 2018-11-21 ENCOUNTER — Other Ambulatory Visit: Payer: Self-pay | Admitting: Medical

## 2018-11-22 NOTE — Telephone Encounter (Signed)
Is this ok to refill?  

## 2019-01-25 ENCOUNTER — Other Ambulatory Visit: Payer: Self-pay | Admitting: Medical

## 2019-02-13 ENCOUNTER — Other Ambulatory Visit: Payer: Self-pay | Admitting: Medical

## 2019-02-20 ENCOUNTER — Other Ambulatory Visit: Payer: Self-pay | Admitting: Medical

## 2019-05-14 ENCOUNTER — Other Ambulatory Visit: Payer: Self-pay | Admitting: Medical

## 2019-05-19 ENCOUNTER — Other Ambulatory Visit: Payer: Self-pay | Admitting: Medical

## 2019-05-24 ENCOUNTER — Encounter: Payer: Self-pay | Admitting: Medical

## 2019-05-24 ENCOUNTER — Ambulatory Visit
Admission: RE | Admit: 2019-05-24 | Discharge: 2019-05-24 | Disposition: A | Discharge status: Home / Self Care | Payer: BLUE CROSS/BLUE SHIELD | Source: Ambulatory Visit | Attending: Medical | Admitting: Medical

## 2019-05-24 ENCOUNTER — Ambulatory Visit: Payer: BLUE CROSS/BLUE SHIELD | Admitting: Medical

## 2019-05-24 ENCOUNTER — Other Ambulatory Visit: Payer: Self-pay

## 2019-05-24 VITALS — BP 134/72 | HR 84 | Temp 98.6°F | Ht 68.5 in | Wt 387.8 lb

## 2019-05-24 DIAGNOSIS — E785 Hyperlipidemia, unspecified: Secondary | ICD-10-CM | POA: Diagnosis not present

## 2019-05-24 DIAGNOSIS — M545 Low back pain, unspecified: Secondary | ICD-10-CM

## 2019-05-24 DIAGNOSIS — M4726 Other spondylosis with radiculopathy, lumbar region: Secondary | ICD-10-CM | POA: Diagnosis not present

## 2019-05-24 DIAGNOSIS — Z7185 Encounter for immunization safety counseling: Secondary | ICD-10-CM

## 2019-05-24 DIAGNOSIS — R609 Edema, unspecified: Secondary | ICD-10-CM

## 2019-05-24 DIAGNOSIS — I10 Essential (primary) hypertension: Secondary | ICD-10-CM | POA: Diagnosis not present

## 2019-05-24 DIAGNOSIS — M5410 Radiculopathy, site unspecified: Secondary | ICD-10-CM | POA: Diagnosis not present

## 2019-05-24 DIAGNOSIS — I872 Venous insufficiency (chronic) (peripheral): Secondary | ICD-10-CM

## 2019-05-24 DIAGNOSIS — Z7189 Other specified counseling: Secondary | ICD-10-CM | POA: Insufficient documentation

## 2019-05-24 DIAGNOSIS — Z129 Encounter for screening for malignant neoplasm, site unspecified: Secondary | ICD-10-CM | POA: Insufficient documentation

## 2019-05-24 NOTE — Progress Notes (Signed)
Subjective: Chief Complaint  Patient presents with  . Medication Management  . Back Pain    loewer back    Here for med check.  Last visit a year ago.  Her main concern is acute back pain.  She notes having back pain in late February, was moving and unloading things, no specific injury but was had acute back pain issues for several days, couldn't walk well, had to rest.   Has had intermittent back pain since then but not like today. Last week was working in the yard, wonders if she over did things, and for days has had significant back pain that radiates into legs.   Left leg has had some numbness.   No fever.  No blood in urine or stool.  No numbness in genital region or rectum.   No incontinence  She has continued to gain weight particular with the Covid problem and work been busy, also never got her pool open back up to start swimming.  She notes when she can swim regularly her weight is more controlled.  Hyperlipidemia-not taking statin, and she just says she has not been taking it in general for no particular reason  Hypertension - she is compliant with losartan, Metoprolol, Lasix.   No other new complaint   Past Medical History:  Diagnosis Date  . Allergy   . Cancer ([place])    cervix  . Colon polyp 03/29/2011   sessile polyp  . Dyslipidemia   . Encounter for routine gynecological examination    Dr. Corinna Capra  . H/O mammogram    yearly per gynecology, last one 2014  . Hypertension   . Obesity    Current Outpatient Medications on File Prior to Visit  Medication Sig Dispense Refill  . albuterol (PROVENTIL HFA;VENTOLIN HFA) 108 (90 BASE) MCG/ACT inhaler Inhale 2 puffs into the lungs every 6 (six) hours as needed for wheezing or shortness of breath. 1 Inhaler 0  . aspirin 81 MG EC tablet TAKE 1 TABLET BY MOUTH EVERY DAY 90 tablet 2  . cetirizine (ZYRTEC) 10 MG tablet Take 10 mg by mouth 4 (four) times daily as needed for allergies.    Marland Kitchen EPINEPHrine (EPI-PEN) 0.3 mg/0.3 mL SOAJ  injection Inject 0.3 mLs (0.3 mg total) into the muscle once. 1 Device 0  . furosemide (LASIX) 20 MG tablet TAKE 1 TABLET BY MOUTH EVERY DAY 30 tablet 0  . losartan (COZAAR) 100 MG tablet TAKE 1 TABLET BY MOUTH EVERY DAY 30 tablet 0  . metoprolol tartrate (LOPRESSOR) 50 MG tablet TAKE 1 TABLET BY MOUTH TWICE A DAY 60 tablet 0  . Multiple Vitamins-Minerals (MULTIVITAMIN WITH MINERALS) tablet Take 1 tablet by mouth daily.      . rosuvastatin (CRESTOR) 20 MG tablet Take 1 tablet (20 mg total) by mouth at bedtime. 90 tablet 3   No current facility-administered medications on file prior to visit.    Past Surgical History:  Procedure Laterality Date  . COLONOSCOPY  2014   polyp,Dr. Verl Blalock, repeat 2018  . conization cervix     4 times  . TONSILLECTOMY    . WISDOM TOOTH EXTRACTION      Family History  Problem Relation Age of Onset  . Colon cancer Mother   . Heart disease Father        congenital anomaly, had surgery  . Hypertension Maternal Grandmother     Objective: BP 134/72   Pulse 84   Temp 98.6 F (37 C)   Ht 5'  8.5" (1.74 m)   Wt (!) 387 lb 12.8 oz (175.9 kg)   SpO2 93%   BMI 58.11 kg/m   Wt Readings from Last 3 Encounters:  05/24/19 (!) 387 lb 12.8 oz (175.9 kg)  03/03/18 (!) 363 lb 12.8 oz (165 kg)  10/21/17 (!) 348 lb 9.6 oz (158.1 kg)   Gen: Morbidly obese white female, no acute distress Lungs clear Heart regular rate and rhythm, no murmur, normal S1-S2 Abdomen nontender no mass no organomegaly Back tender in right lumbar paraspinal region and right sciatic notch, flexion is relatively full but with some pain, but definitely more pain noted with back extension which is limited, no midline tenderness Legs nontender, relatively normal range of motion of hips Bilateral lower extremity 1+ nonpitting edema with somewhat rough patches of skin and pinkish-red coloration consistent with venous stasis and chronic venous insufficiency Pulses WNL upper and lower  extremities Neuro: Positive right-sided straight leg raise, unable to toe and heel walk, DTRs 1+ bilateral lower extremity, otherwise nonfocal exam     Assessment: Encounter Diagnoses  Name Primary?  . Lumbar pain Yes  . Radicular pain of lower extremity   . Essential hypertension   . Morbid obesity ([place])   . Hyperlipidemia, unspecified hyperlipidemia type   . Chronic venous insufficiency   . Edema, unspecified type   . Screening for cancer   . Vaccine counseling   . Venous stasis dermatitis of both lower extremities     Plan: We discussed her medications, her significant weight gain this year in particular.  I have been pleased with her the last year's to do something significant to work on weight changes.  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  I discussed the concern I have for her morbid obesity and associated risk.  I encouraged her to take this seriously and less find a way to get the weight off.  She is past due on cancer screening in general  We will check labs today.    She is not compliant with statin  Back pain- I suspect a bulging disc or radicular source of pain.  We will send for x-ray but will likely need to get an MRI.  Pain medicine as below short-term.  Discussed risk and benefits of medication.    She will get her flu shot at local pharmacy and send [place] a copy of information  We discussed the following instructions and recommendations:  Patient Instructions  Recommendations: I am really concerned about your weight gain    The point we need to consider significant efforts to get this under control  This includes exercise such as getting back into the pool most days per week  Consider [employer] or the club or other indoor pool  Also try other forms of exercise once the back pains calms down  I recommend you eat 3 meals a day small meals but not  just once a day as this slows down metabolism  I recommend we consider medication to help with weight loss or consider bariatric surgery  You are past due on cancer screenings including mammogram Pap smear and colonoscopy  Let us start working on getting these done.  Let me know if you are ready to be referred for colonoscopy   Please go to [facility] for your back xray.   Their hours are 8am - 3:30 pm Monday - Friday.  Take your insurance card with you.  [facility] [phone]  [address], [place], [place] [address]  [address], [place] [address]     Gill was seen today for medication management and back pain.  Diagnoses and all orders for this visit:  Lumbar pain -     DG Lumbar Spine Complete; Future  Radicular pain of lower extremity  Essential hypertension -     Comprehensive metabolic panel -     Hemoglobin A1c -     TSH  Morbid obesity (HCC) -     Hemoglobin A1c -     TSH  Hyperlipidemia, unspecified hyperlipidemia type -     Comprehensive metabolic panel -     CBC -     Lipid panel  Chronic venous insufficiency  Edema, unspecified type  Screening for cancer  Vaccine counseling  Venous stasis dermatitis of both lower extremities

## 2019-05-24 NOTE — Patient Instructions (Signed)
Recommendations: I am really concerned about your weight gain    The point we need to consider significant efforts to get this under control  This includes exercise such as getting back into the pool most days per week  Consider [employer] or the club or other indoor pool  Also try other forms of exercise once the back pains calms down  I recommend you eat 3 meals a day small meals but not just once a day as this slows down metabolism  I recommend we consider medication to help with weight loss or consider bariatric surgery  You are past due on cancer screenings including mammogram Pap smear and colonoscopy  Let us start working on getting these done.  Let me know if you are ready to be referred for colonoscopy   Please go to [facility] for your back xray.   Their hours are 8am - 3:30 pm Monday - Friday.  Take your insurance card with you.  [facility] [phone]  [address], [place], [place] [address]  [address] [place],  [address]

## 2019-05-25 ENCOUNTER — Other Ambulatory Visit: Payer: Self-pay | Admitting: Medical

## 2019-05-25 ENCOUNTER — Encounter: Payer: Self-pay | Admitting: Medical

## 2019-05-25 ENCOUNTER — Telehealth: Payer: Self-pay | Admitting: Internal Medicine

## 2019-05-25 LAB — CBC
Hematocrit: 40 % (ref 34.0–46.6)
Hemoglobin: 13.8 g/dL (ref 11.1–15.9)
MCH: 34.6 pg — ABNORMAL HIGH (ref 26.6–33.0)
MCHC: 34.5 g/dL (ref 31.5–35.7)
MCV: 100 fL — ABNORMAL HIGH (ref 79–97)
Platelets: 217 10*3/uL (ref 150–450)
RBC: 3.99 x10E6/uL (ref 3.77–5.28)
RDW: 11.6 % — ABNORMAL LOW (ref 11.7–15.4)
WBC: 4.1 10*3/uL (ref 3.4–10.8)

## 2019-05-25 LAB — LIPID PANEL
Chol/HDL Ratio: 4.1 ratio (ref 0.0–4.4)
Cholesterol, Total: 227 mg/dL — ABNORMAL HIGH (ref 100–199)
HDL: 56 mg/dL (ref >39)
LDL Chol Calc (NIH): 141 mg/dL — ABNORMAL HIGH (ref 0–99)
Triglycerides: 168 mg/dL — ABNORMAL HIGH (ref 0–149)
VLDL Cholesterol Cal: 30 mg/dL (ref 5–40)

## 2019-05-25 LAB — COMPREHENSIVE METABOLIC PANEL
ALT: 53 IU/L — ABNORMAL HIGH (ref 0–32)
AST: 44 IU/L — ABNORMAL HIGH (ref 0–40)
Albumin/Globulin Ratio: 1.5 (ref 1.2–2.2)
Albumin: 4.1 g/dL (ref 3.8–4.9)
Alkaline Phosphatase: 92 IU/L (ref 39–117)
BUN/Creatinine Ratio: 14 (ref 9–23)
BUN: 13 mg/dL (ref 6–24)
Bilirubin Total: 0.6 mg/dL (ref 0.0–1.2)
CO2: 26 mmol/L (ref 20–29)
Calcium: 9.5 mg/dL (ref 8.7–10.2)
Chloride: 105 mmol/L (ref 96–106)
Creatinine, Ser: 0.91 mg/dL (ref 0.57–1.00)
GFR calc Af Amer: 82 mL/min/{1.73_m2} (ref >59)
GFR calc non Af Amer: 71 mL/min/{1.73_m2} (ref >59)
Globulin, Total: 2.7 g/dL (ref 1.5–4.5)
Glucose: 105 mg/dL — ABNORMAL HIGH (ref 65–99)
Potassium: 4.6 mmol/L (ref 3.5–5.2)
Sodium: 145 mmol/L — ABNORMAL HIGH (ref 134–144)
Total Protein: 6.8 g/dL (ref 6.0–8.5)

## 2019-05-25 LAB — HEMOGLOBIN A1C
Est. average glucose Bld gHb Est-mCnc: 100 mg/dL
Hgb A1c MFr Bld: 5.1 % (ref 4.8–5.6)

## 2019-05-25 LAB — TSH: TSH: 3.58 u[IU]/mL (ref 0.450–4.500)

## 2019-05-25 MED ORDER — HYDROCODONE-ACETAMINOPHEN 7.5-325 MG PO TABS: 1.0000 | ORAL_TABLET | Freq: Four times a day (QID) | ORAL | 0 refills | Status: AC | PRN

## 2019-05-25 MED ORDER — HYDROCODONE-ACETAMINOPHEN 7.5-325 MG PO TABS: 1.0000 | ORAL_TABLET | Freq: Four times a day (QID) | ORAL | 0 refills | Status: DC | PRN

## 2019-05-25 NOTE — Telephone Encounter (Signed)
Pt called and states that she thought you were going to send in a pain med to her pharmacy for the pain.

## 2019-05-25 NOTE — Telephone Encounter (Signed)
Medication sent.

## 2019-05-30 ENCOUNTER — Encounter: Payer: Self-pay | Admitting: Medical

## 2019-05-30 ENCOUNTER — Other Ambulatory Visit: Payer: Self-pay | Admitting: Medical

## 2019-05-30 MED ORDER — LOSARTAN POTASSIUM 100 MG PO TABS: 100.0000 mg | ORAL_TABLET | Freq: Every day | ORAL | 3 refills | Status: DC

## 2019-05-30 MED ORDER — ASPIRIN 81 MG PO TBEC: 81.0000 mg | DELAYED_RELEASE_TABLET | Freq: Every day | ORAL | 3 refills | Status: DC

## 2019-05-30 MED ORDER — METOPROLOL TARTRATE 50 MG PO TABS: 50.0000 mg | ORAL_TABLET | Freq: Two times a day (BID) | ORAL | 0 refills | Status: DC

## 2019-05-30 MED ORDER — ROSUVASTATIN CALCIUM 20 MG PO TABS: 20.0000 mg | ORAL_TABLET | Freq: Every day | ORAL | 3 refills | Status: DC

## 2019-05-30 MED ORDER — FUROSEMIDE 20 MG PO TABS: 20.0000 mg | ORAL_TABLET | Freq: Every day | ORAL | 3 refills | Status: DC

## 2019-09-10 ENCOUNTER — Other Ambulatory Visit: Payer: Self-pay | Admitting: Medical

## 2019-12-18 ENCOUNTER — Other Ambulatory Visit: Payer: Self-pay | Admitting: Medical

## 2020-03-10 ENCOUNTER — Other Ambulatory Visit: Payer: Self-pay | Admitting: Medical

## 2020-04-08 ENCOUNTER — Other Ambulatory Visit: Payer: Self-pay | Admitting: Medical

## 2020-04-08 NOTE — Telephone Encounter (Signed)
Left message for pt to call back to schedule an appt then we can refill for 30 days

## 2020-05-06 ENCOUNTER — Telehealth: Payer: Self-pay | Admitting: Medical

## 2020-05-06 NOTE — Telephone Encounter (Signed)
Pt called back and scheduled medcheck for next tuesday

## 2020-05-09 ENCOUNTER — Other Ambulatory Visit: Payer: Self-pay | Admitting: Medical

## 2020-05-09 NOTE — Telephone Encounter (Signed)
Patient has appointment 05/14/20

## 2020-05-14 ENCOUNTER — Encounter: Payer: Self-pay | Admitting: Medical

## 2020-05-14 ENCOUNTER — Other Ambulatory Visit: Payer: Self-pay

## 2020-05-14 ENCOUNTER — Ambulatory Visit: Payer: BC Managed Care – PPO | Admitting: Medical

## 2020-05-14 VITALS — BP 130/80 | HR 68 | Temp 98.4°F | Wt 374.0 lb

## 2020-05-14 DIAGNOSIS — Z1211 Encounter for screening for malignant neoplasm of colon: Secondary | ICD-10-CM

## 2020-05-14 DIAGNOSIS — L508 Other urticaria: Secondary | ICD-10-CM

## 2020-05-14 DIAGNOSIS — Z7185 Encounter for immunization safety counseling: Secondary | ICD-10-CM

## 2020-05-14 DIAGNOSIS — Z0000 Encounter for general adult medical examination without abnormal findings: Secondary | ICD-10-CM

## 2020-05-14 DIAGNOSIS — L989 Disorder of the skin and subcutaneous tissue, unspecified: Secondary | ICD-10-CM | POA: Insufficient documentation

## 2020-05-14 DIAGNOSIS — E785 Hyperlipidemia, unspecified: Secondary | ICD-10-CM

## 2020-05-14 DIAGNOSIS — Z800 Family history of malignant neoplasm of digestive organs: Secondary | ICD-10-CM

## 2020-05-14 DIAGNOSIS — I10 Essential (primary) hypertension: Secondary | ICD-10-CM | POA: Diagnosis not present

## 2020-05-14 DIAGNOSIS — R7989 Other specified abnormal findings of blood chemistry: Secondary | ICD-10-CM | POA: Diagnosis not present

## 2020-05-14 DIAGNOSIS — I872 Venous insufficiency (chronic) (peripheral): Secondary | ICD-10-CM

## 2020-05-14 DIAGNOSIS — D7589 Other specified diseases of blood and blood-forming organs: Secondary | ICD-10-CM | POA: Diagnosis not present

## 2020-05-14 DIAGNOSIS — Z129 Encounter for screening for malignant neoplasm, site unspecified: Secondary | ICD-10-CM

## 2020-05-14 DIAGNOSIS — N939 Abnormal uterine and vaginal bleeding, unspecified: Secondary | ICD-10-CM | POA: Insufficient documentation

## 2020-05-14 DIAGNOSIS — Z7189 Other specified counseling: Secondary | ICD-10-CM

## 2020-05-14 DIAGNOSIS — Z131 Encounter for screening for diabetes mellitus: Secondary | ICD-10-CM | POA: Insufficient documentation

## 2020-05-14 NOTE — Progress Notes (Signed)
Subjective:  Christina Velasquez is a 54 y.o. female who presents for Chief Complaint  Patient presents with  . other    med check     Here for physical.   Exercise - working in the yard.  Not a set routine.  She did have covid vaccine.  Sees Dr. Corinna Capra for gynecology, past due on mammogram and pap.  Also due for colonoscopy.  LMP currently but a month ago horrible bleeding.   Prior to that no bleeding in a year.  Sometimes forgets to take cholesterol pill and metoprolol pill at night  No other aggravating or relieving factors.    No other c/o.  Past Medical History:  Diagnosis Date  . Allergy   . Cancer ([place])    cervix  . Colon polyp 03/29/2011   sessile polyp  . Dyslipidemia   . Encounter for routine gynecological examination    Dr. Corinna Capra  . H/O mammogram    yearly per gynecology, last one 2014  . Hypertension   . Obesity     Past Surgical History:  Procedure Laterality Date  . COLONOSCOPY  2014   polyp,Dr. Verl Blalock, repeat 2018  . conization cervix     4 times  . TONSILLECTOMY    . WISDOM TOOTH EXTRACTION      Family History  Problem Relation Age of Onset  . Colon cancer Mother   . Heart disease Father        congenital anomaly, had surgery  . Hypertension Maternal Grandmother      Current Outpatient Medications:  .  aspirin 81 MG EC tablet, Take 1 tablet (81 mg total) by mouth daily. Swallow whole., Disp: 90 tablet, Rfl: 3 .  furosemide (LASIX) 20 MG tablet, Take 1 tablet (20 mg total) by mouth daily., Disp: 90 tablet, Rfl: 3 .  losartan (COZAAR) 100 MG tablet, Take 1 tablet (100 mg total) by mouth daily., Disp: 90 tablet, Rfl: 3 .  metoprolol tartrate (LOPRESSOR) 50 MG tablet, TAKE 1 TABLET BY MOUTH TWICE A DAY, Disp: 60 tablet, Rfl: 0 .  Multiple Vitamins-Minerals (MULTIVITAMIN WITH MINERALS) tablet, Take 1 tablet by mouth daily.  , Disp: , Rfl:  .  rosuvastatin (CRESTOR) 20 MG tablet, Take 1 tablet (20 mg total) by mouth at bedtime., Disp: 90  tablet, Rfl: 3 .  albuterol (PROVENTIL HFA;VENTOLIN HFA) 108 (90 BASE) MCG/ACT inhaler, Inhale 2 puffs into the lungs every 6 (six) hours as needed for wheezing or shortness of breath., Disp: 1 Inhaler, Rfl: 0 .  cetirizine (ZYRTEC) 10 MG tablet, Take 10 mg by mouth 4 (four) times daily as needed for allergies., Disp: , Rfl:  .  EPINEPHrine (EPI-PEN) 0.3 mg/0.3 mL SOAJ injection, Inject 0.3 mLs (0.3 mg total) into the muscle once., Disp: 1 Device, Rfl: 0  No Known Allergies    The following portions of the patient's history were reviewed and updated as appropriate: allergies, current medications, past family history, past medical history, past social history, past surgical history and problem list.  ROS Otherwise as in subjective above    Objective: BP 130/80   Pulse 68   Temp 98.4 F (36.9 C)   Wt (!) 374 lb (169.6 kg)   LMP  (LMP Unknown)   BMI 56.04 kg/m    BP Readings from Last 3 Encounters:  05/14/20 130/80  05/24/19 134/72  03/03/18 (!) 160/100    Wt Readings from Last 3 Encounters:  05/14/20 (!) 374 lb (169.6 kg)  05/24/19 Marland Kitchen)  387 lb 12.8 oz (175.9 kg)  03/03/18 (!) 363 lb 12.8 oz (165 kg)    General appearance: alert, no distress, well developed, well nourished, white female Skin: scattered small 2-54mm diameter roundish lesions on arms suggestive of AKs, upper and lower arms, some crusted scaly 2-3 mm diameter lesions on arms as well suggestive of AKs HENT/oral - deferred due to covid precautions Neck: supple, no lymphadenopathy, no thyromegaly, no masses, no bruits Heart: RRR, normal S1, S2, no murmurs Lungs: CTA bilaterally, no wheezes, rhonchi, or rales Abdomen: +bs, soft, non tender, non distended, no masses, no hepatomegaly, no splenomegaly Pulses: 2+ radial pulses, 2+ pedal pulses, normal cap refill Ext: no edema Neuro: nonfocal exam Breast/gyn/rectal - deferred    Assessment: Encounter Diagnoses  Name Primary?  . Encounter for health maintenance  examination in adult Yes  . Screen for colon cancer   . Abnormal uterine bleeding   . Essential hypertension   . Chronic venous insufficiency   . Venous stasis dermatitis of both lower extremities   . Chronic urticaria   . Family history of colon cancer   . Hyperlipidemia, unspecified hyperlipidemia type   . Morbid obesity ([place])   . Screening for cancer   . Vaccine counseling   . Screening for diabetes mellitus      Plan: Discussed exercise, healthy eating, medications ,cancer screenings, vaccines, and general preventative care.  See eye doctor and dentist yearly  Counseled on focusing on her health as much as she does her work.  hypertension and hyperlipidemia - pending labs, considering once daily morning dosing on everything for better compliance  Skin lesions - advised dermatology consult  Obesity - counseled on strategies to lose weight   Cancer screens past due advised she f/u with gynecology ASAP given past due on screening and abnormal menstrual bleeding  Advised she schedule mammogram  referral back to Dr. Sharlett Iles for updated colonoscopy   Vaccines: She will get flu shot at work free  Shingles vaccine:  I recommend you have a shingles vaccine to help prevent shingles or herpes zoster outbreak.   Please call your insurer to inquire about coverage for the Shingrix vaccine given in 2 doses.   Some insurers cover this vaccine after age 64, some cover this after age 68.  If your insurer covers this, then call to schedule appointment to have this vaccine here.  She is up to date on Td and covid vaccine   Kailan was seen today for other.  Diagnoses and all orders for this visit:  Encounter for health maintenance examination in adult -     Comprehensive metabolic panel -     CBC -     Lipid panel -     TSH -     Hemoglobin A1c -     MM DIGITAL SCREENING BILATERAL; Future -     Ambulatory referral to Gastroenterology  Screen for colon cancer -      Ambulatory referral to Gastroenterology  Abnormal uterine bleeding -     CBC  Essential hypertension -     Comprehensive metabolic panel  Chronic venous insufficiency  Venous stasis dermatitis of both lower extremities  Chronic urticaria  Family history of colon cancer  Hyperlipidemia, unspecified hyperlipidemia type -     Lipid panel  Morbid obesity ([place])  Screening for cancer -     MM DIGITAL SCREENING BILATERAL; Future -     Ambulatory referral to Gastroenterology  Vaccine counseling  Screening for diabetes mellitus  Follow up: pending referral, labs

## 2020-05-15 ENCOUNTER — Other Ambulatory Visit: Payer: Self-pay | Admitting: Medical

## 2020-05-15 DIAGNOSIS — D7589 Other specified diseases of blood and blood-forming organs: Secondary | ICD-10-CM

## 2020-05-15 DIAGNOSIS — R7989 Other specified abnormal findings of blood chemistry: Secondary | ICD-10-CM | POA: Insufficient documentation

## 2020-05-15 LAB — COMPREHENSIVE METABOLIC PANEL
ALT: 41 IU/L — ABNORMAL HIGH (ref 0–32)
AST: 32 IU/L (ref 0–40)
Albumin/Globulin Ratio: 1.5 (ref 1.2–2.2)
Albumin: 4.2 g/dL (ref 3.8–4.9)
Alkaline Phosphatase: 89 IU/L (ref 48–121)
BUN/Creatinine Ratio: 19 (ref 9–23)
BUN: 16 mg/dL (ref 6–24)
Bilirubin Total: 1 mg/dL (ref 0.0–1.2)
CO2: 21 mmol/L (ref 20–29)
Calcium: 9.1 mg/dL (ref 8.7–10.2)
Chloride: 103 mmol/L (ref 96–106)
Creatinine, Ser: 0.86 mg/dL (ref 0.57–1.00)
GFR calc Af Amer: 87 mL/min/{1.73_m2} (ref >59)
GFR calc non Af Amer: 76 mL/min/{1.73_m2} (ref >59)
Globulin, Total: 2.8 g/dL (ref 1.5–4.5)
Glucose: 96 mg/dL (ref 65–99)
Potassium: 4.3 mmol/L (ref 3.5–5.2)
Sodium: 141 mmol/L (ref 134–144)
Total Protein: 7 g/dL (ref 6.0–8.5)

## 2020-05-15 LAB — LIPID PANEL
Chol/HDL Ratio: 2.7 ratio (ref 0.0–4.4)
Cholesterol, Total: 178 mg/dL (ref 100–199)
HDL: 66 mg/dL (ref >39)
LDL Chol Calc (NIH): 89 mg/dL (ref 0–99)
Triglycerides: 132 mg/dL (ref 0–149)
VLDL Cholesterol Cal: 23 mg/dL (ref 5–40)

## 2020-05-15 LAB — CBC
Hematocrit: 36.9 % (ref 34.0–46.6)
Hemoglobin: 12.5 g/dL (ref 11.1–15.9)
MCH: 34.1 pg — ABNORMAL HIGH (ref 26.6–33.0)
MCHC: 33.9 g/dL (ref 31.5–35.7)
MCV: 101 fL — ABNORMAL HIGH (ref 79–97)
Platelets: 207 10*3/uL (ref 150–450)
RBC: 3.67 x10E6/uL — ABNORMAL LOW (ref 3.77–5.28)
RDW: 12.3 % (ref 11.7–15.4)
WBC: 5 10*3/uL (ref 3.4–10.8)

## 2020-05-15 LAB — HEMOGLOBIN A1C
Est. average glucose Bld gHb Est-mCnc: 100 mg/dL
Hgb A1c MFr Bld: 5.1 % (ref 4.8–5.6)

## 2020-05-15 LAB — TSH: TSH: 4.3 u[IU]/mL (ref 0.450–4.500)

## 2020-05-15 MED ORDER — ROSUVASTATIN CALCIUM 20 MG PO TABS: 20.0000 mg | ORAL_TABLET | Freq: Every day | ORAL | 3 refills | Status: DC

## 2020-05-15 MED ORDER — METOPROLOL SUCCINATE ER 50 MG PO TB24: 50.0000 mg | ORAL_TABLET | Freq: Every day | ORAL | 3 refills | Status: DC

## 2020-05-15 MED ORDER — LOSARTAN POTASSIUM 100 MG PO TABS: 100.0000 mg | ORAL_TABLET | Freq: Every day | ORAL | 3 refills | Status: DC

## 2020-05-15 MED ORDER — ASPIRIN 81 MG PO TBEC: 81.0000 mg | DELAYED_RELEASE_TABLET | Freq: Every day | ORAL | 3 refills | Status: DC

## 2020-05-15 MED ORDER — FUROSEMIDE 20 MG PO TABS: 20.0000 mg | ORAL_TABLET | Freq: Every day | ORAL | 3 refills | Status: DC

## 2020-05-16 LAB — HEPATITIS C ANTIBODY: Hep C Virus Ab: <0.1 s/co ratio (ref 0.0–0.9)

## 2020-05-16 LAB — SPECIMEN STATUS REPORT

## 2020-05-16 LAB — HEPATITIS B SURFACE ANTIGEN: Hepatitis B Surface Ag: NEGATIVE

## 2020-05-16 LAB — VITAMIN B12: Vitamin B-12: 397 pg/mL (ref 232–1245)

## 2020-11-12 ENCOUNTER — Encounter: Payer: Self-pay | Admitting: Gastroenterology

## 2020-11-13 DIAGNOSIS — S8992XA Unspecified injury of left lower leg, initial encounter: Secondary | ICD-10-CM | POA: Diagnosis not present

## 2020-11-13 DIAGNOSIS — M25562 Pain in left knee: Secondary | ICD-10-CM | POA: Diagnosis not present

## 2020-11-13 DIAGNOSIS — S8991XA Unspecified injury of right lower leg, initial encounter: Secondary | ICD-10-CM | POA: Diagnosis not present

## 2020-11-13 DIAGNOSIS — M1711 Unilateral primary osteoarthritis, right knee: Secondary | ICD-10-CM | POA: Diagnosis not present

## 2020-11-13 DIAGNOSIS — M1712 Unilateral primary osteoarthritis, left knee: Secondary | ICD-10-CM | POA: Diagnosis not present

## 2020-11-13 DIAGNOSIS — M5416 Radiculopathy, lumbar region: Secondary | ICD-10-CM | POA: Diagnosis not present

## 2020-11-29 DIAGNOSIS — Z1231 Encounter for screening mammogram for malignant neoplasm of breast: Secondary | ICD-10-CM | POA: Diagnosis not present

## 2020-11-29 LAB — HM MAMMOGRAPHY

## 2020-12-13 ENCOUNTER — Encounter: Payer: Self-pay | Admitting: Medical

## 2020-12-18 ENCOUNTER — Telehealth: Payer: Self-pay | Admitting: *Deleted

## 2020-12-18 NOTE — Telephone Encounter (Signed)
I think would be best to see in clinic first to discuss options. Thanks

## 2020-12-18 NOTE — Telephone Encounter (Signed)
Dr.Armbruster,  This patient is overdue for a colonoscopy. Her last colon with Dr.Patterson 2012. Her last BMI 05/2020 was 56, weight 374 lbs. Would you like OV or direct colonoscopy at [facility]? Please advise.  Thank you, Jodi Kappes pv

## 2020-12-23 NOTE — Telephone Encounter (Signed)
Called patient no answer left a message

## 2020-12-23 NOTE — Telephone Encounter (Signed)
Called patient, no answer. Left a message for the patient to call us back to make an OV.

## 2020-12-25 NOTE — Telephone Encounter (Signed)
Spoke with the patient. Gave her Dr.Armbruster's recommendations. Made OV for 5/27 at 950 am. Pt aware of appointment.

## 2021-01-22 ENCOUNTER — Encounter: Payer: BLUE CROSS/BLUE SHIELD | Admitting: Gastroenterology

## 2021-01-31 ENCOUNTER — Ambulatory Visit: Payer: BC Managed Care – PPO | Admitting: Gastroenterology

## 2021-02-06 ENCOUNTER — Other Ambulatory Visit: Payer: Self-pay

## 2021-02-06 ENCOUNTER — Ambulatory Visit: Payer: BC Managed Care – PPO | Admitting: Family Medicine

## 2021-02-06 ENCOUNTER — Encounter: Payer: Self-pay | Admitting: Family Medicine

## 2021-02-06 VITALS — BP 152/82 | HR 92 | Temp 98.9°F | Wt 366.2 lb

## 2021-02-06 DIAGNOSIS — R000 Tachycardia, unspecified: Secondary | ICD-10-CM | POA: Diagnosis not present

## 2021-02-06 DIAGNOSIS — I10 Essential (primary) hypertension: Secondary | ICD-10-CM

## 2021-02-06 NOTE — Progress Notes (Signed)
   Subjective:    Patient ID: Christina Velasquez, female    DOB: 1966-07-08, 55 y.o.   MRN: [id number]  HPI States that she woke up this morning and noted that her heart rate was quite fast.  She did not take the pulse on that but did state that it was considered reliably above 100.  She states that it took her several hours for it to return to normal.  No previous history of difficulty with that.  She is on metoprolol and Cozaar for her blood pressure.   Review of Systems     Objective:   Physical Exam Alert and in no distress.  Cardiac exam does show a rate of around 90.  Lungs are clear to auscultation. Review of blood work shows normal thyroid functioning in September of last year. EKG read by me shows a ventricular rate of 87 with other parameters being normal.  No evidence of cardiac disease.     Assessment & Plan:  Tachycardia  Essential hypertension I explained that I do not have a good reason for the rapid heart rate that which is 1 episode of this, do not feel the need to chase after this but if it occurs again, she is to call for further consultation.

## 2021-03-19 ENCOUNTER — Other Ambulatory Visit: Payer: Self-pay

## 2021-03-19 ENCOUNTER — Ambulatory Visit: Payer: BC Managed Care – PPO | Admitting: Gastroenterology

## 2021-03-19 ENCOUNTER — Encounter: Payer: Self-pay | Admitting: Gastroenterology

## 2021-03-19 VITALS — BP 134/90 | HR 88 | Ht 66.5 in | Wt 369.0 lb

## 2021-03-19 DIAGNOSIS — Z86010 Personal history of colonic polyps: Secondary | ICD-10-CM | POA: Diagnosis not present

## 2021-03-19 DIAGNOSIS — Z800 Family history of malignant neoplasm of digestive organs: Secondary | ICD-10-CM

## 2021-03-19 MED ORDER — PLENVU 140 G PO SOLR: ORAL | 0 refills | Status: DC

## 2021-03-19 NOTE — Patient Instructions (Signed)
If you are age 57 or older, your body mass index should be between 23-30. Your Body mass index is 58.67 kg/m. If this is out of the aforementioned range listed, please consider follow up with your Primary Care Provider.  If you are age 9 or younger, your body mass index should be between 19-25. Your Body mass index is 58.67 kg/m. If this is out of the aformentioned range listed, please consider follow up with your Primary Care Provider.   You have been scheduled for a colonoscopy. Please follow written instructions given to you at your visit today.  Please pick up your prep supplies at the pharmacy within the next 1-3 days. If you use inhalers (even only as needed), please bring them with you on the day of your procedure.  The [facility] providers would like to encourage you to use [facility] to communicate with providers for non-urgent requests or questions.  Due to long hold times on the telephone, sending your provider a message by [facility] may be a faster and more efficient way to get a response.  Please allow 48 business hours for a response.  Please remember that this is for non-urgent requests.   It was a pleasure to see you today!  Thank you for trusting me with your gastrointestinal care!    Scott E. Candis Schatz, MD

## 2021-03-19 NOTE — Progress Notes (Signed)
HPI : Christina Velasquez is a very pleasant 55 year old female with a history of cervical cancer and a family history of colon cancer who was referred for surveillance colonoscopy.  Her last colonoscopy was done by Dr. Sharlett Iles in 2012 and was notable only for a small left-sided tubular adenoma.  She was recommended to repeat colonoscopy in 5 years.  She had a normal colonoscopy in 2003.  The patient's mother was diagnosed with colon cancer at age 102.  She denies any chronic bothersome GI symptoms to include abdominal pain, constipation, diarrhea or bloody stools.  Occasionally she will see scant amounts of blood on the toilet paper.  She also denies any chronic upper GI symptoms to include heartburn, acid regurgitation, nausea/vomiting or dyspepsia.  No dysphagia. She denies any problems with her previous endoscopic examinations including sedation complications or bowel prep intolerance. She is morbidly obese with a BMI of 59, but she denies any known cardiopulmonary comorbidities.  She does not have a diagnosis of sleep apnea.  She does elevate her head of bed with 3 pillows at night, but she states this is due to back pain and not related to dyspnea.  Past Medical History:  Diagnosis Date   Allergy    Cervical cancer ([place])    cervix   Colon polyp 03/29/2011   sessile polyp   Dyslipidemia    Encounter for routine gynecological examination    Dr. Corinna Capra   H/O mammogram    yearly per gynecology, last one 2014   Hypertension    Obesity      Past Surgical History:  Procedure Laterality Date   COLONOSCOPY  09/07/2012   polyp,Dr. Verl Blalock, repeat 2018   conization cervix     x 4   TONSILLECTOMY     WISDOM TOOTH EXTRACTION     Family History  Problem Relation Age of Onset   Colon cancer Mother        mets to liver   Liver cancer Mother    Heart disease Father 49       congenital anomaly, had surgery   Hypertension Maternal Grandmother    Pancreatic cancer Maternal Grandfather     Liver cancer Maternal Grandfather    Social History   Tobacco Use   Smoking status: Former    Pack years: 0.00    Types: Cigarettes    Quit date: 1995    Years since quitting: 27.5   Smokeless tobacco: Never  Vaping Use   Vaping Use: Never used  Substance Use Topics   Alcohol use: Yes    Alcohol/week: 5.0 standard drinks    Types: 5 Glasses of wine per week    Comment: 2 per day   Drug use: No   Current Outpatient Medications  Medication Sig Dispense Refill   aspirin 81 MG EC tablet Take 1 tablet (81 mg total) by mouth daily. Swallow whole. 90 tablet 3   furosemide (LASIX) 20 MG tablet Take 1 tablet (20 mg total) by mouth daily. 90 tablet 3   losartan (COZAAR) 100 MG tablet Take 1 tablet (100 mg total) by mouth daily. 90 tablet 3   metoprolol succinate (TOPROL XL) 50 MG 24 hr tablet Take 1 tablet (50 mg total) by mouth daily. Take with or immediately following a meal. 90 tablet 3   Multiple Vitamins-Minerals (MULTIVITAMIN WITH MINERALS) tablet Take 1 tablet by mouth daily.     rosuvastatin (CRESTOR) 20 MG tablet Take 1 tablet (20 mg total) by mouth at bedtime.  90 tablet 3   No current facility-administered medications for this visit.   No Known Allergies   Review of Systems: All systems reviewed and negative except where noted in HPI.    No results found.  Physical Exam: BP 134/90 (BP Location: Left Arm, Patient Position: Sitting, Cuff Size: Large)   Pulse 88   Ht 5' 6.5" (1.689 m) Comment: height measured without shoes  Wt (!) 369 lb (167.4 kg)   LMP 08/21/2020   BMI 58.67 kg/m  Constitutional: Pleasant,well-developed, Caucasian female in no acute distress. HEENT: Normocephalic and atraumatic. Conjunctivae are normal. No scleral icterus. Cardiovascular: Normal rate, regular rhythm.  Pulmonary/chest: Effort normal and breath sounds normal. No wheezing, rales or rhonchi. Abdominal: Soft, nondistended, nontender. Bowel sounds active throughout. There are no masses  palpable. No hepatomegaly. Extremities: Venous stasis changes, no edema Neurological: Alert and oriented to person place and time. Skin: Skin is warm and dry. No rashes noted. Psychiatric: Normal mood and affect. Behavior is normal.  CBC    Component Value Date/Time   WBC 5.0 05/14/2020 1115   WBC 3.7 (L) 02/10/2017 0845   RBC 3.67 (L) 05/14/2020 1115   RBC 4.15 02/10/2017 0845   HGB 12.5 05/14/2020 1115   HCT 36.9 05/14/2020 1115   PLT 207 05/14/2020 1115   MCV 101 (H) 05/14/2020 1115   MCH 34.1 (H) 05/14/2020 1115   MCH 32.5 02/10/2017 0845   MCHC 33.9 05/14/2020 1115   MCHC 33.4 02/10/2017 0845   RDW 12.3 05/14/2020 1115   LYMPHSABS 1.8 09/30/2015 0001   MONOABS 0.6 09/30/2015 0001   EOSABS 0.1 09/30/2015 0001   BASOSABS 0.1 09/30/2015 0001    CMP     Component Value Date/Time   NA 141 05/14/2020 1115   K 4.3 05/14/2020 1115   CL 103 05/14/2020 1115   CO2 21 05/14/2020 1115   GLUCOSE 96 05/14/2020 1115   GLUCOSE 103 (H) 02/10/2017 0845   BUN 16 05/14/2020 1115   CREATININE 0.86 05/14/2020 1115   CREATININE 0.72 02/10/2017 0845   CALCIUM 9.1 05/14/2020 1115   PROT 7.0 05/14/2020 1115   ALBUMIN 4.2 05/14/2020 1115   AST 32 05/14/2020 1115   ALT 41 (H) 05/14/2020 1115   ALKPHOS 89 05/14/2020 1115   BILITOT 1.0 05/14/2020 1115   GFRNONAA 76 05/14/2020 1115   GFRAA 87 05/14/2020 1115     ASSESSMENT AND PLAN: 55 year old female with family history of colon cancer (mother, 66s) and a personal history of adenomatous polyps (1 small TA, 2012) overdue for next surveillance colonoscopy.  She denies any chronic GI symptoms and denies any cardiopulmonary comorbidities or symptoms of an underlying cardiopulmonary condition.  We will schedule for surveillance colonoscopy.  Due to her BMI (greater than 50), the patient's procedure will need to be performed in the hospital setting.  History of adenomatous polyps/family history of colon cancer - Surveillance colonoscopy to be  performed at [facility] -Patient will need colonoscopies every 5 years due to family history alone, sooner if high risk polyps found  The details, risks (including bleeding, perforation, infection, missed lesions, medication reactions and possible hospitalization or surgery if complications occur), benefits, and alternatives to colonoscopy with possible biopsy and possible polypectomy were discussed with the patient and she consents to proceed.     Denita Lung, MD

## 2021-03-26 NOTE — Progress Notes (Signed)
Attempted to obtain medical history via telephone, unable to reach at this time. I left a voicemail to return pre surgical testing department's phone call.  

## 2021-03-28 ENCOUNTER — Telehealth: Payer: Self-pay | Admitting: Gastroenterology

## 2021-03-28 NOTE — Telephone Encounter (Signed)
Inbound call from pt requesting a call back stating she wants to cancel her appt on 04/01/21 and she will call back to reschedule her appt at [facility]. Please advise. Thanks.

## 2021-03-28 NOTE — Telephone Encounter (Signed)
Appt cancelled per pt request. She states she will call back to reschedule. Dr. Havery Moros aware.

## 2021-03-28 NOTE — Telephone Encounter (Signed)
Kara Mead.  Feel free to use that slot for any patient needing a hospital case. Thanks

## 2021-03-31 NOTE — Telephone Encounter (Signed)
Patient is a Dr. Candis Schatz patient but was scheduled with Dr. Havery Moros b/c she could not do Dr. Dayle Points next hospital date of August 9th.  When she calls back to schedule patient can be scheduled with any doctor starting in Sept on a Monday or Tuesday morning that has time at the hospital.

## 2021-04-01 ENCOUNTER — Encounter (HOSPITAL_COMMUNITY): Admission: RE | Payer: Self-pay | Source: Home / Self Care

## 2021-04-01 ENCOUNTER — Ambulatory Visit (HOSPITAL_COMMUNITY)
Admission: RE | Admit: 2021-04-01 | Payer: BC Managed Care – PPO | Source: Home / Self Care | Admitting: Gastroenterology

## 2021-04-01 SURGERY — COLONOSCOPY WITH PROPOFOL
Anesthesia: Monitor Anesthesia Care

## 2021-05-18 ENCOUNTER — Other Ambulatory Visit: Payer: Self-pay | Admitting: Medical

## 2021-05-27 ENCOUNTER — Other Ambulatory Visit: Payer: Self-pay | Admitting: Medical

## 2021-05-28 NOTE — Telephone Encounter (Signed)
Pt was informed already about needing an appt. Will refill 30 days only

## 2021-06-01 ENCOUNTER — Other Ambulatory Visit: Payer: Self-pay | Admitting: Medical

## 2021-06-16 ENCOUNTER — Other Ambulatory Visit: Payer: Self-pay | Admitting: Medical

## 2021-06-18 ENCOUNTER — Other Ambulatory Visit: Payer: Self-pay | Admitting: Medical

## 2021-06-19 ENCOUNTER — Other Ambulatory Visit: Payer: Self-pay | Admitting: Family Medicine

## 2021-07-04 ENCOUNTER — Telehealth: Payer: Self-pay | Admitting: Family Medicine

## 2021-07-04 MED ORDER — METOPROLOL SUCCINATE ER 50 MG PO TB24: 50.0000 mg | ORAL_TABLET | Freq: Every day | ORAL | 0 refills | Status: DC

## 2021-07-04 NOTE — Telephone Encounter (Signed)
Pt called for refills of metoprolol. She has an appointment next week but is out. Please send to [place].

## 2021-07-10 ENCOUNTER — Encounter: Payer: BC Managed Care – PPO | Admitting: Medical

## 2021-07-16 ENCOUNTER — Ambulatory Visit (INDEPENDENT_AMBULATORY_CARE_PROVIDER_SITE_OTHER): Payer: BC Managed Care – PPO | Admitting: Medical

## 2021-07-16 ENCOUNTER — Other Ambulatory Visit: Payer: Self-pay

## 2021-07-16 ENCOUNTER — Encounter: Payer: Self-pay | Admitting: Medical

## 2021-07-16 VITALS — BP 110/70 | HR 86 | Wt 348.2 lb

## 2021-07-16 DIAGNOSIS — Z636 Dependent relative needing care at home: Secondary | ICD-10-CM

## 2021-07-16 DIAGNOSIS — Z800 Family history of malignant neoplasm of digestive organs: Secondary | ICD-10-CM

## 2021-07-16 DIAGNOSIS — E785 Hyperlipidemia, unspecified: Secondary | ICD-10-CM | POA: Diagnosis not present

## 2021-07-16 DIAGNOSIS — Z1211 Encounter for screening for malignant neoplasm of colon: Secondary | ICD-10-CM

## 2021-07-16 DIAGNOSIS — Z131 Encounter for screening for diabetes mellitus: Secondary | ICD-10-CM | POA: Diagnosis not present

## 2021-07-16 DIAGNOSIS — Z860100 Personal history of colon polyps, unspecified: Secondary | ICD-10-CM

## 2021-07-16 DIAGNOSIS — G8929 Other chronic pain: Secondary | ICD-10-CM

## 2021-07-16 DIAGNOSIS — I872 Venous insufficiency (chronic) (peripheral): Secondary | ICD-10-CM

## 2021-07-16 DIAGNOSIS — Z2821 Immunization not carried out because of patient refusal: Secondary | ICD-10-CM

## 2021-07-16 DIAGNOSIS — M25552 Pain in left hip: Secondary | ICD-10-CM

## 2021-07-16 DIAGNOSIS — M25551 Pain in right hip: Secondary | ICD-10-CM

## 2021-07-16 DIAGNOSIS — M25562 Pain in left knee: Secondary | ICD-10-CM

## 2021-07-16 DIAGNOSIS — M5442 Lumbago with sciatica, left side: Secondary | ICD-10-CM

## 2021-07-16 DIAGNOSIS — M5441 Lumbago with sciatica, right side: Secondary | ICD-10-CM

## 2021-07-16 DIAGNOSIS — R7989 Other specified abnormal findings of blood chemistry: Secondary | ICD-10-CM | POA: Diagnosis not present

## 2021-07-16 DIAGNOSIS — Z136 Encounter for screening for cardiovascular disorders: Secondary | ICD-10-CM

## 2021-07-16 DIAGNOSIS — I10 Essential (primary) hypertension: Secondary | ICD-10-CM

## 2021-07-16 DIAGNOSIS — M25561 Pain in right knee: Secondary | ICD-10-CM

## 2021-07-16 MED ORDER — LOSARTAN POTASSIUM 100 MG PO TABS: 100.0000 mg | ORAL_TABLET | Freq: Every day | ORAL | 3 refills | Status: DC

## 2021-07-16 MED ORDER — FUROSEMIDE 20 MG PO TABS: 20.0000 mg | ORAL_TABLET | Freq: Every day | ORAL | 0 refills | Status: DC

## 2021-07-16 MED ORDER — METOPROLOL SUCCINATE ER 50 MG PO TB24: 50.0000 mg | ORAL_TABLET | Freq: Every day | ORAL | 3 refills | Status: DC

## 2021-07-16 MED ORDER — ASPIRIN 81 MG PO TBEC: 81.0000 mg | DELAYED_RELEASE_TABLET | Freq: Every day | ORAL | 3 refills | Status: DC

## 2021-07-16 NOTE — Progress Notes (Signed)
Subjective:  Christina Velasquez is a 55 y.o. female who presents for Chief Complaint  Patient presents with   med check    Med check. Discuss pain all over body.      Medical team: Dr. Dustin Flock, Dr. [place] Cellar, gastroenterology Dr. Corinna Capra, gynecology Here for med check.  Last visit over 1 year ago.  Concerns: Compliant with BP medications.  Checks BP occasionally.   Readings typically fine.  She does report compliance with losartan 100 mg daily, metoprolol XL 50 mg daily, Lasix 20 mg daily, aspirin.    Hyperlipidemia - she has not been taking her cholesterol medication  Was scheduled for colonoscopy in the summer ,but had to postpone.   Still is planning to get this back on schedule.    Has been having a lot of problems with knee pains.  Has been having pains in hips, knees and back.  Saw clinic in [place] a few months ago, had knee xrays.  Was told bone on bone.  Was given steroid and pain medications which gave her good relief until the medication wore off.  Has not had hip xrays.   Has back xray in past year.    Had allergic reaction few months ago, had rash all over arms.  Used zyrtec and benadryl during the flare up.  It took weeks to clear up.   In the past seems to have allergies to lots of things.  Has seen allergist in past, was allergic to "everything that crawls"  Obesity - exercise is limited due to busy schedule and joint pains  No other aggravating or relieving factors.    No other c/o.  Past Medical History:  Diagnosis Date   Allergy    Cervical cancer ([place])    cervix   Colon polyp 03/29/2011   sessile polyp   Dyslipidemia    Encounter for routine gynecological examination    Dr. Corinna Capra   H/O mammogram    yearly per gynecology, last one 2014   Hypertension    Obesity    Current Outpatient Medications on File Prior to Visit  Medication Sig Dispense Refill   Multiple Vitamins-Minerals (MULTIVITAMIN WITH MINERALS) tablet Take 1 tablet by mouth  daily.     rosuvastatin (CRESTOR) 20 MG tablet TAKE 1 TABLET BY MOUTH EVERYDAY AT BEDTIME (Patient not taking: Reported on 07/16/2021) 30 tablet 0   No current facility-administered medications on file prior to visit.    Family History  Problem Relation Age of Onset   Colon cancer Mother        mets to liver   Liver cancer Mother    Heart disease Father 98       congenital anomaly, had surgery   Hypertension Maternal Grandmother    Pancreatic cancer Maternal Grandfather    Liver cancer Maternal Grandfather     The following portions of the patient's history were reviewed and updated as appropriate: allergies, current medications, past family history, past medical history, past social history, past surgical history and problem list.  ROS Otherwise as in subjective above    Objective: BP 110/70   Pulse 86   Wt (!) 348 lb 3.2 oz (157.9 kg)   BMI 55.36 kg/m   Wt Readings from Last 3 Encounters:  07/16/21 (!) 348 lb 3.2 oz (157.9 kg)  03/19/21 (!) 369 lb (167.4 kg)  02/06/21 (!) 366 lb 3.2 oz (166.1 kg)   BP Readings from Last 3 Encounters:  07/16/21 110/70  03/19/21 134/90  02/06/21 (!) 152/82    General appearance: alert, no distress, well developed, well nourished, morbidly obese white female Neck: supple, no lymphadenopathy, no thyromegaly, no masses Heart: RRR, normal S1, S2, no murmurs Lungs: CTA bilaterally, no wheezes, rhonchi, or rales Abdomen: +bs, soft, non tender, non distended, no masses, no hepatomegaly, no splenomegaly Pulses: 2+ radial pulses, 1+ pedal pulses, normal cap refill Lower extremities with chronic venous insufficiency, varicose veins, dry skin, skin changes consistent with venous stasis, nonpitting 1+ edema bilaterally MSK: Bilateral knees tender throughout, no obvious joint laxity but tender basically everywhere, somewhat guarded due to pain, bilateral hips pain noted with external hip range of motion which is somewhat decreased, legs otherwise  nontender Normal leg strength and sensation, decreased DTRs of knees    Assessment: Encounter Diagnoses  Name Primary?   Essential hypertension Yes   Hyperlipidemia, unspecified hyperlipidemia type    History of colonic polyps    Family history of colon cancer    Morbid obesity ([place])    Venous stasis dermatitis of both lower extremities    Screening for heart disease    Screening for diabetes mellitus    Chronic pain of both knees    Hip pain, bilateral    Chronic low back pain with bilateral sciatica, unspecified back pain laterality    Influenza vaccination declined    Screen for colon cancer    Caregiver burden      Plan: Hypertension-continue current medications.  Routine labs today  Hyperlipidemia-poor compliance on medication.  Labs today  Screen for heart disease-referral to cardiology for baseline evaluation given morbid obesity, hypertension, hyperlipidemia with poor compliance, and referral regarding surgery clearance given the likelihood that she may end up having to have surgery for her knees and certainly needs to have a colonoscopy done  Obesity-needs to continue efforts through diet and exercise to lose weight.  She has lost some weight since last visit here.  She is limited with exercise due to joint pains.  We discussed hand bike versus water aerobics.  she declines referral to bariatric clinic for weight loss benefit.  She may be open to medications to help.  Chronic joint pains including low back pain, hip pain, knee pains-I do not have access to her recent knee x-rays which were reportedly severe.  We will make a repeat referral to orthopedics at this point.  Screening for colon cancer, history of colon polyps, family history of colon cancer-advise she get back on the schedule for colonoscopy.  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  Christina Velasquez was seen today for med check.  Diagnoses and all orders for this visit:  Essential hypertension -     Comprehensive metabolic panel -     CBC -     Ambulatory referral to Cardiology  Hyperlipidemia, unspecified hyperlipidemia type -     Comprehensive metabolic panel -     Lipid panel -     Ambulatory referral to Cardiology  History of colonic polyps  Family history of colon cancer  Morbid obesity ([place]) -     Hemoglobin A1c -     Ambulatory referral to Cardiology  Venous stasis dermatitis of both lower extremities  Screening for heart disease -     Ambulatory referral to Cardiology  Screening for diabetes mellitus -     Hemoglobin A1c  Chronic pain  of both knees -     AMB referral to orthopedics  Hip pain, bilateral -     AMB referral to orthopedics  Chronic low back pain with bilateral sciatica, unspecified back pain laterality -     AMB referral to orthopedics  Influenza vaccination declined  Screen for colon cancer  Caregiver burden  Other orders -     metoprolol succinate (TOPROL-XL) 50 MG 24 hr tablet; Take 1 tablet (50 mg total) by mouth daily. Take with or immediately following a meal. -     losartan (COZAAR) 100 MG tablet; Take 1 tablet (100 mg total) by mouth daily. -     furosemide (LASIX) 20 MG tablet; Take 1 tablet (20 mg total) by mouth daily. -     aspirin 81 MG EC tablet; Take 1 tablet (81 mg total) by mouth daily. Swallow whole.  Spent > 45 minutes with her care including face to face with patient in discussion of symptoms, evaluation, plan and recommendations.    Follow up: pending labs, referrals

## 2021-07-17 ENCOUNTER — Other Ambulatory Visit: Payer: Self-pay | Admitting: Medical

## 2021-07-17 LAB — HEMOGLOBIN A1C
Est. average glucose Bld gHb Est-mCnc: 100 mg/dL
Hgb A1c MFr Bld: 5.1 % (ref 4.8–5.6)

## 2021-07-17 LAB — LIPID PANEL
Chol/HDL Ratio: 4.2 ratio (ref 0.0–4.4)
Cholesterol, Total: 270 mg/dL — ABNORMAL HIGH (ref 100–199)
HDL: 64 mg/dL (ref >39)
LDL Chol Calc (NIH): 174 mg/dL — ABNORMAL HIGH (ref 0–99)
Triglycerides: 173 mg/dL — ABNORMAL HIGH (ref 0–149)
VLDL Cholesterol Cal: 32 mg/dL (ref 5–40)

## 2021-07-17 LAB — COMPREHENSIVE METABOLIC PANEL
ALT: 81 IU/L — ABNORMAL HIGH (ref 0–32)
AST: 61 IU/L — ABNORMAL HIGH (ref 0–40)
Albumin/Globulin Ratio: 2 (ref 1.2–2.2)
Albumin: 4.5 g/dL (ref 3.8–4.9)
Alkaline Phosphatase: 91 IU/L (ref 44–121)
BUN/Creatinine Ratio: 20 (ref 9–23)
BUN: 15 mg/dL (ref 6–24)
Bilirubin Total: 0.9 mg/dL (ref 0.0–1.2)
CO2: 24 mmol/L (ref 20–29)
Calcium: 9.5 mg/dL (ref 8.7–10.2)
Chloride: 102 mmol/L (ref 96–106)
Creatinine, Ser: 0.76 mg/dL (ref 0.57–1.00)
Globulin, Total: 2.2 g/dL (ref 1.5–4.5)
Glucose: 101 mg/dL — ABNORMAL HIGH (ref 70–99)
Potassium: 4.1 mmol/L (ref 3.5–5.2)
Sodium: 141 mmol/L (ref 134–144)
Total Protein: 6.7 g/dL (ref 6.0–8.5)
eGFR: 91 mL/min/{1.73_m2} (ref >59)

## 2021-07-17 LAB — CBC
Hematocrit: 40.6 % (ref 34.0–46.6)
Hemoglobin: 14.1 g/dL (ref 11.1–15.9)
MCH: 34.1 pg — ABNORMAL HIGH (ref 26.6–33.0)
MCHC: 34.7 g/dL (ref 31.5–35.7)
MCV: 98 fL — ABNORMAL HIGH (ref 79–97)
Platelets: 200 10*3/uL (ref 150–450)
RBC: 4.13 x10E6/uL (ref 3.77–5.28)
RDW: 12.9 % (ref 11.7–15.4)
WBC: 3.7 10*3/uL (ref 3.4–10.8)

## 2021-07-17 MED ORDER — ROSUVASTATIN CALCIUM 20 MG PO TABS: ORAL_TABLET | ORAL | 3 refills | Status: DC

## 2021-07-19 LAB — SPECIMEN STATUS REPORT

## 2021-07-19 LAB — IRON: Iron: 180 ug/dL — ABNORMAL HIGH (ref 27–159)

## 2021-07-21 ENCOUNTER — Other Ambulatory Visit: Payer: Self-pay | Admitting: Internal Medicine

## 2021-07-21 DIAGNOSIS — R7989 Other specified abnormal findings of blood chemistry: Secondary | ICD-10-CM

## 2021-07-21 DIAGNOSIS — R790 Abnormal level of blood mineral: Secondary | ICD-10-CM

## 2021-08-06 ENCOUNTER — Ambulatory Visit: Payer: BC Managed Care – PPO | Admitting: Cardiovascular Disease

## 2021-08-06 NOTE — Progress Notes (Deleted)
Cardiology Office Note  Date:  08/06/2021   ID:  Christina Velasquez, Christina Velasquez [birth year], MRN [id number]  PCP:  Denita Lung, MD   No chief complaint on file.   HPI:   Christina Velasquez is a 55 year old woman with past medical history of Morbid obesity Hypertension Hyperlipidemia Referred by Chana Bode for consultation of her hypertension, hyperlipidemia, screening for heart disease   Prior history tachycardia June 2022 On metoprolol, Cozaar    PMH:   has a past medical history of Allergy, Cervical cancer ([place]), Colon polyp (03/29/2011), Dyslipidemia, Encounter for routine gynecological examination, H/O mammogram, Hypertension, and Obesity.  PSH:    Past Surgical History:  Procedure Laterality Date   COLONOSCOPY  09/07/2012   polyp,Dr. Verl Blalock, repeat 2018   conization cervix     x 4   TONSILLECTOMY     WISDOM TOOTH EXTRACTION      Current Outpatient Medications  Medication Sig Dispense Refill   aspirin 81 MG EC tablet Take 1 tablet (81 mg total) by mouth daily. Swallow whole. 90 tablet 3   furosemide (LASIX) 20 MG tablet Take 1 tablet (20 mg total) by mouth daily. 30 tablet 0   losartan (COZAAR) 100 MG tablet Take 1 tablet (100 mg total) by mouth daily. 90 tablet 3   metoprolol succinate (TOPROL-XL) 50 MG 24 hr tablet Take 1 tablet (50 mg total) by mouth daily. Take with or immediately following a meal. 90 tablet 3   Multiple Vitamins-Minerals (MULTIVITAMIN WITH MINERALS) tablet Take 1 tablet by mouth daily.     rosuvastatin (CRESTOR) 20 MG tablet TAKE 1 TABLET BY MOUTH EVERYDAY AT BEDTIME 90 tablet 3   No current facility-administered medications for this visit.     Allergies:   Patient has no known allergies.   Social History:  The patient  reports that she quit smoking about 27 years ago. Her smoking use included cigarettes. She has never used smokeless tobacco. She reports current alcohol use of about 5.0 standard drinks per week. She reports that  she does not use drugs.   Family History:   family history includes Colon cancer in her mother; Heart disease (age of onset: 87) in her father; Hypertension in her maternal grandmother; Liver cancer in her maternal grandfather and mother; Pancreatic cancer in her maternal grandfather.    Review of Systems: ROS   PHYSICAL EXAM: VS:  There were no vitals taken for this visit. , BMI There is no height or weight on file to calculate BMI. GEN: Well nourished, well developed, in no acute distress HEENT: normal Neck: no JVD, carotid bruits, or masses Cardiac: RRR; no murmurs, rubs, or gallops,no edema  Respiratory:  clear to auscultation bilaterally, normal work of breathing GI: soft, nontender, nondistended, + BS Christina: no deformity or atrophy Skin: warm and dry, no rash Neuro:  Strength and sensation are intact Psych: euthymic mood, full affect    Recent Labs: 07/16/2021: ALT 81; BUN 15; Creatinine, Ser 0.76; Hemoglobin 14.1; Platelets 200; Potassium 4.1; Sodium 141    Lipid Panel Lab Results  Component Value Date   CHOL 270 (H) 07/16/2021   HDL 64 07/16/2021   LDLCALC 174 (H) 07/16/2021   TRIG 173 (H) 07/16/2021      Wt Readings from Last 3 Encounters:  07/16/21 (!) 348 lb 3.2 oz (157.9 kg)  03/19/21 (!) 369 lb (167.4 kg)  02/06/21 (!) 366 lb 3.2 oz (166.1 kg)       ASSESSMENT AND PLAN:  Problem  List Items Addressed This Visit   None    Disposition:   F/U  12 months   Total encounter time more than 25 minutes  Greater than 50% was spent in counseling and coordination of care with the patient    Signed, Esmond Plants, M.D., Ph.D. [place], [place]

## 2021-08-08 ENCOUNTER — Telehealth: Payer: Self-pay

## 2021-08-08 NOTE — Telephone Encounter (Signed)
Lvm for pt to call and schedule an annual exam with Dr. Redmond School. Pt just had a med check  07/16/21 and will need a cpe in six months. [place]

## 2021-08-14 DIAGNOSIS — M25562 Pain in left knee: Secondary | ICD-10-CM | POA: Diagnosis not present

## 2021-08-14 DIAGNOSIS — M25561 Pain in right knee: Secondary | ICD-10-CM | POA: Diagnosis not present

## 2021-08-14 DIAGNOSIS — G8929 Other chronic pain: Secondary | ICD-10-CM | POA: Diagnosis not present

## 2021-08-14 DIAGNOSIS — M170 Bilateral primary osteoarthritis of knee: Secondary | ICD-10-CM | POA: Diagnosis not present

## 2021-08-16 ENCOUNTER — Other Ambulatory Visit: Payer: Self-pay | Admitting: Medical

## 2021-08-20 ENCOUNTER — Telehealth: Payer: Self-pay | Admitting: Family Medicine

## 2021-08-20 NOTE — Telephone Encounter (Signed)
Pt was notified but states the aleve or tylenol doesn't work. Can you prescribe something short term for her

## 2021-08-20 NOTE — Telephone Encounter (Signed)
Pt called and stats that the ortho dr wants to put her on celebrex but she seen that it could interact with her lasix and her losartan she wants to make sure you think its ok before she takes the medicine

## 2021-08-21 NOTE — Telephone Encounter (Signed)
Pt will reach out to ortho about going on another medication

## 2021-08-26 ENCOUNTER — Encounter: Payer: Self-pay | Admitting: Cardiovascular Disease

## 2021-08-26 ENCOUNTER — Ambulatory Visit: Payer: BC Managed Care – PPO | Admitting: Cardiovascular Disease

## 2021-08-26 ENCOUNTER — Other Ambulatory Visit: Payer: Self-pay

## 2021-08-26 VITALS — BP 134/84 | HR 84 | Ht 66.5 in | Wt 351.0 lb

## 2021-08-26 DIAGNOSIS — I479 Paroxysmal tachycardia, unspecified: Secondary | ICD-10-CM | POA: Diagnosis not present

## 2021-08-26 DIAGNOSIS — I10 Essential (primary) hypertension: Secondary | ICD-10-CM | POA: Diagnosis not present

## 2021-08-26 DIAGNOSIS — E782 Mixed hyperlipidemia: Secondary | ICD-10-CM

## 2021-08-26 NOTE — Patient Instructions (Addendum)
Medication Instructions:  Your physician recommends that you continue on your current medications as directed. Please refer to the Current Medication list given to you today.   If you need a refill on your cardiac medications before your next appointment, please call your pharmacy.    Lab work: No new labs needed   If you have labs (blood work) drawn today and your tests are completely normal, you will receive your results only by: [place] (if you have MyChart) OR A paper copy in the mail If you have any lab test that is abnormal or we need to change your treatment, we will call you to review the results.   Testing/Procedures: No new testing needed   Follow-Up: At [facility], you and your health needs are our priority.  As part of our continuing mission to provide you with exceptional heart care, we have created designated Provider Care Teams.  These Care Teams include your primary Cardiologist (physician) and Advanced Practice Providers (APPs -  Physician Assistants and Nurse Practitioners) who all work together to provide you with the care you need, when you need it.  You will need a follow up appointment as needed  Providers on your designated Care Team:   Murray Hodgkins, NP Christell Faith, PA-C Cadence Kathlen Mody, PA-C  Any Other Special Instructions Will Be Listed Below (If Applicable).  COVID-19 Vaccine Information can be found at: [email or web address].uk For questions related to vaccine distribution or appointments, please email [email or web address] or call [phone].

## 2021-08-26 NOTE — Progress Notes (Signed)
Cardiology Office Note  Date:  08/26/2021   ID:  Skyley, Grandmaison 1965-12-02, MRN [id number]  PCP:  Denita Lung, MD   Chief Complaint  Patient presents with   New Patient (Initial Visit)    New patient to establish care with provider for tachycardia. Medications verbally reviewed with patient.     HPI:  Ms Christina Velasquez is a 55 year old woman with past medical history of Morbid obesity Hypertension Hyperlipidemia Osteoarthritis knees Referred by Chana Bode for consultation of her hypertension, hyperlipidemia, screening for heart disease  Prior history tachycardia June 2022 Denies feeling particularly stressed on that day,  Tachycardia seemed relatively acute, strong, fast sensation Lasted for 10 minutes or so Did some deep breathing, eventually resolved on its own Denied any stimulants that could have contributed to symptoms Unable to definitively exclude stress but feels it was likely an inappropriate rhythm  She has been maintained on metoprolol, Cozaar No further episodes since that time  Trouble with knee pain Cholesterol running high off Crestor, recently restarted Crestor 20 daily  Denies diabetes, no smoking history  EKG personally reviewed by myself on todays visit Normal sinus rhythm rate 84 bpm no significant ST-T wave changes  Family hx of arrhythmia, ablation  PMH:   has a past medical history of Allergy, Cervical cancer ([place]), Colon polyp (03/29/2011), Dyslipidemia, Encounter for routine gynecological examination, H/O mammogram, Hypertension, and Obesity.  PSH:    Past Surgical History:  Procedure Laterality Date   COLONOSCOPY  09/07/2012   polyp,Dr. Verl Blalock, repeat 2018   conization cervix     x 4   TONSILLECTOMY     WISDOM TOOTH EXTRACTION      Current Outpatient Medications  Medication Sig Dispense Refill   aspirin 81 MG EC tablet Take 1 tablet (81 mg total) by mouth daily. Swallow whole. 90 tablet 3   furosemide  (LASIX) 20 MG tablet TAKE 1 TABLET BY MOUTH EVERY DAY 30 tablet 1   losartan (COZAAR) 100 MG tablet Take 1 tablet (100 mg total) by mouth daily. 90 tablet 3   metoprolol succinate (TOPROL-XL) 50 MG 24 hr tablet Take 1 tablet (50 mg total) by mouth daily. Take with or immediately following a meal. 90 tablet 3   Multiple Vitamins-Minerals (MULTIVITAMIN WITH MINERALS) tablet Take 1 tablet by mouth daily.     rosuvastatin (CRESTOR) 20 MG tablet TAKE 1 TABLET BY MOUTH EVERYDAY AT BEDTIME 90 tablet 3   No current facility-administered medications for this visit.    Allergies:   Patient has no known allergies.   Social History:  The patient  reports that she quit smoking about 27 years ago. Her smoking use included cigarettes. She has never used smokeless tobacco. She reports current alcohol use of about 5.0 standard drinks per week. She reports that she does not use drugs.   Family History:   family history includes Colon cancer in her mother; Heart disease (age of onset: 55) in her father; Hypertension in her maternal grandmother; Liver cancer in her maternal grandfather and mother; Pancreatic cancer in her maternal grandfather.    Review of Systems: Review of Systems  Constitutional: Negative.   HENT: Negative.    Respiratory: Negative.    Cardiovascular: Negative.   Gastrointestinal: Negative.   Musculoskeletal: Negative.   Neurological: Negative.   Psychiatric/Behavioral: Negative.    All other systems reviewed and are negative.   PHYSICAL EXAM: VS:  BP 134/84 (BP Location: Left Arm, Patient Position: Sitting, Cuff Size: Large)  Pulse 84    Ht 5' 6.5" (1.689 m)    Wt (!) 351 lb (159.2 kg)    SpO2 95%    BMI 55.80 kg/m  , BMI Body mass index is 55.8 kg/m. GEN: Well nourished, well developed, in no acute distress HEENT: normal Neck: no JVD, carotid bruits, or masses Cardiac: RRR; no murmurs, rubs, or gallops,no edema  Respiratory:  clear to auscultation bilaterally, normal work of  breathing GI: soft, nontender, nondistended, + BS MS: no deformity or atrophy Skin: warm and dry, no rash Neuro:  Strength and sensation are intact Psych: euthymic mood, full affect   Recent Labs: 07/16/2021: ALT 81; BUN 15; Creatinine, Ser 0.76; Hemoglobin 14.1; Platelets 200; Potassium 4.1; Sodium 141    Lipid Panel Lab Results  Component Value Date   CHOL 270 (H) 07/16/2021   HDL 64 07/16/2021   LDLCALC 174 (H) 07/16/2021   TRIG 173 (H) 07/16/2021      Wt Readings from Last 3 Encounters:  08/26/21 (!) 351 lb (159.2 kg)  07/16/21 (!) 348 lb 3.2 oz (157.9 kg)  03/19/21 (!) 369 lb (167.4 kg)      ASSESSMENT AND PLAN:  Problem List Items Addressed This Visit       Cardiology Problems   Essential hypertension   Hyperlipidemia     Other   Morbid obesity ([place])   Other Visit Diagnoses     Paroxysmal tachycardia ([place])    -  Primary   Relevant Orders   EKG 12-Lead      Paroxysmal tachycardia Discussed various types of arrhythmia including atrial tachycardia or even SVT Less likely atrial flutter, atrial fibrillation Discussed various types of stimulants, cold medications etc. to avoid After further discussion, we will continue metoprolol at current dose For recurrent episodes discussed possibly increasing metoprolol dosing, ZIO monitor, Discussed carotid sinus massage, Valsalva Even calling EMTs for prolonged tachyarrhythmia Given normal EKG, normal clinical exam, we have held off on ordering echocardiogram though certainly this could be done for further symptoms  Hyperlipidemia Strongly recommended she stay on her Crestor 20, drop in total cholesterol around 100 points, discussed with her  Morbid obesity Limited secondary to chronic knee pain Recommended calorie restriction Would only be able to do low impact exercise  Essential hypertension Blood pressure is well controlled on today's visit. No changes made to the medications.    Total encounter time  more than 60 minutes  Greater than 50% was spent in counseling and coordination of care with the patient  Patient seen in consultation for Chana Bode or be referred back to his office for ongoing care of the issues detailed above   Signed, Esmond Plants, M.D., Ph.D. [place], [place]

## 2021-08-26 NOTE — Progress Notes (Deleted)
Cardiology Office Note  Date:  08/26/2021   ID:  Christina Velasquez March 27, 1966, MRN [id number]  PCP:  Christina Lung, MD   No chief complaint on file.   HPI:  Ms Christina Velasquez is a 55 year old woman with past medical history of Morbid obesity Hypertension Hyperlipidemia Osteoarthritis knees Referred by Christina Velasquez for consultation of her hypertension, hyperlipidemia, screening for heart disease   Prior history tachycardia June 2022 On metoprolol, Cozaar     PMH:   has a past medical history of Allergy, Cervical cancer ([place]), Colon polyp (03/29/2011), Dyslipidemia, Encounter for routine gynecological examination, H/O mammogram, Hypertension, and Obesity.  PSH:    Past Surgical History:  Procedure Laterality Date   COLONOSCOPY  09/07/2012   polyp,Dr. Verl Blalock, repeat 2018   conization cervix     x 4   TONSILLECTOMY     WISDOM TOOTH EXTRACTION      Current Outpatient Medications  Medication Sig Dispense Refill   aspirin 81 MG EC tablet Take 1 tablet (81 mg total) by mouth daily. Swallow whole. 90 tablet 3   furosemide (LASIX) 20 MG tablet TAKE 1 TABLET BY MOUTH EVERY DAY 30 tablet 1   losartan (COZAAR) 100 MG tablet Take 1 tablet (100 mg total) by mouth daily. 90 tablet 3   metoprolol succinate (TOPROL-XL) 50 MG 24 hr tablet Take 1 tablet (50 mg total) by mouth daily. Take with or immediately following a meal. 90 tablet 3   Multiple Vitamins-Minerals (MULTIVITAMIN WITH MINERALS) tablet Take 1 tablet by mouth daily.     rosuvastatin (CRESTOR) 20 MG tablet TAKE 1 TABLET BY MOUTH EVERYDAY AT BEDTIME 90 tablet 3   No current facility-administered medications for this visit.     Allergies:   Patient has no known allergies.   Social History:  The patient  reports that she quit smoking about 27 years ago. Her smoking use included cigarettes. She has never used smokeless tobacco. She reports current alcohol use of about 5.0 standard drinks per week. She  reports that she does not use drugs.   Family History:   family history includes Colon cancer in her mother; Heart disease (age of onset: 64) in her father; Hypertension in her maternal grandmother; Liver cancer in her maternal grandfather and mother; Pancreatic cancer in her maternal grandfather.    Review of Systems: ROS   PHYSICAL EXAM: VS:  There were no vitals taken for this visit. , BMI There is no height or weight on file to calculate BMI. GEN: Well nourished, well developed, in no acute distress HEENT: normal Neck: no JVD, carotid bruits, or masses Cardiac: RRR; no murmurs, rubs, or gallops,no edema  Respiratory:  clear to auscultation bilaterally, normal work of breathing GI: soft, nontender, nondistended, + BS MS: no deformity or atrophy Skin: warm and dry, no rash Neuro:  Strength and sensation are intact Psych: euthymic mood, full affect    Recent Labs: 07/16/2021: ALT 81; BUN 15; Creatinine, Ser 0.76; Hemoglobin 14.1; Platelets 200; Potassium 4.1; Sodium 141    Lipid Panel Lab Results  Component Value Date   CHOL 270 (H) 07/16/2021   HDL 64 07/16/2021   LDLCALC 174 (H) 07/16/2021   TRIG 173 (H) 07/16/2021      Wt Readings from Last 3 Encounters:  07/16/21 (!) 348 lb 3.2 oz (157.9 kg)  03/19/21 (!) 369 lb (167.4 kg)  02/06/21 (!) 366 lb 3.2 oz (166.1 kg)       ASSESSMENT AND PLAN:  Problem  List Items Addressed This Visit   None    Disposition:   F/U  12 months   Total encounter time more than 25 minutes  Greater than 50% was spent in counseling and coordination of care with the patient    Signed, Esmond Plants, M.D., Ph.D. [employer], [place]

## 2021-09-12 DIAGNOSIS — M25562 Pain in left knee: Secondary | ICD-10-CM | POA: Diagnosis not present

## 2021-09-12 DIAGNOSIS — M25561 Pain in right knee: Secondary | ICD-10-CM | POA: Diagnosis not present

## 2021-09-12 DIAGNOSIS — M170 Bilateral primary osteoarthritis of knee: Secondary | ICD-10-CM | POA: Diagnosis not present

## 2021-09-12 DIAGNOSIS — G8929 Other chronic pain: Secondary | ICD-10-CM | POA: Diagnosis not present

## 2021-09-17 ENCOUNTER — Other Ambulatory Visit: Payer: Self-pay | Admitting: Medical

## 2021-10-20 ENCOUNTER — Other Ambulatory Visit: Payer: Self-pay | Admitting: Medical

## 2021-12-09 ENCOUNTER — Ambulatory Visit: Payer: BC Managed Care – PPO | Admitting: Gastroenterology

## 2021-12-25 ENCOUNTER — Ambulatory Visit: Payer: BC Managed Care – PPO | Admitting: Gastroenterology

## 2022-01-21 ENCOUNTER — Other Ambulatory Visit: Payer: Self-pay | Admitting: Medical

## 2022-03-03 DIAGNOSIS — M25461 Effusion, right knee: Secondary | ICD-10-CM | POA: Diagnosis not present

## 2022-03-03 DIAGNOSIS — M170 Bilateral primary osteoarthritis of knee: Secondary | ICD-10-CM | POA: Diagnosis not present

## 2022-03-06 ENCOUNTER — Ambulatory Visit: Payer: BC Managed Care – PPO | Admitting: Medical

## 2022-03-06 VITALS — BP 130/90 | HR 85 | Wt 348.0 lb

## 2022-03-06 DIAGNOSIS — M25561 Pain in right knee: Secondary | ICD-10-CM

## 2022-03-06 DIAGNOSIS — I10 Essential (primary) hypertension: Secondary | ICD-10-CM | POA: Diagnosis not present

## 2022-03-06 DIAGNOSIS — N939 Abnormal uterine and vaginal bleeding, unspecified: Secondary | ICD-10-CM | POA: Insufficient documentation

## 2022-03-06 DIAGNOSIS — G479 Sleep disorder, unspecified: Secondary | ICD-10-CM | POA: Diagnosis not present

## 2022-03-06 DIAGNOSIS — R5383 Other fatigue: Secondary | ICD-10-CM | POA: Diagnosis not present

## 2022-03-06 DIAGNOSIS — G8929 Other chronic pain: Secondary | ICD-10-CM

## 2022-03-06 DIAGNOSIS — M25562 Pain in left knee: Secondary | ICD-10-CM

## 2022-03-06 LAB — BASIC METABOLIC PANEL
BUN/Creatinine Ratio: 21 (ref 9–23)
BUN: 15 mg/dL (ref 6–24)
CO2: 29 mmol/L (ref 20–29)
Calcium: 9.7 mg/dL (ref 8.7–10.2)
Chloride: 101 mmol/L (ref 96–106)
Creatinine, Ser: 0.73 mg/dL (ref 0.57–1.00)
Glucose: 102 mg/dL — ABNORMAL HIGH (ref 70–99)
Potassium: 3.9 mmol/L (ref 3.5–5.2)
Sodium: 139 mmol/L (ref 134–144)
eGFR: 96 mL/min/{1.73_m2} (ref >59)

## 2022-03-06 LAB — CBC WITH DIFFERENTIAL/PLATELET
Basophils Absolute: 0 10*3/uL (ref 0.0–0.2)
Basos: 1 %
EOS (ABSOLUTE): 0.1 10*3/uL (ref 0.0–0.4)
Eos: 1 %
Hematocrit: 40.1 % (ref 34.0–46.6)
Hemoglobin: 13.3 g/dL (ref 11.1–15.9)
Lymphocytes Absolute: 1.2 10*3/uL (ref 0.7–3.1)
Lymphs: 25 %
MCH: 33 pg (ref 26.6–33.0)
MCHC: 33.2 g/dL (ref 31.5–35.7)
MCV: 100 fL — ABNORMAL HIGH (ref 79–97)
Monocytes Absolute: 0.5 10*3/uL (ref 0.1–0.9)
Monocytes: 9 %
Neutrophils Absolute: 3.2 10*3/uL (ref 1.4–7.0)
Neutrophils: 64 %
Platelets: 193 10*3/uL (ref 150–450)
RBC: 4.03 x10E6/uL (ref 3.77–5.28)
RDW: 12.8 % (ref 11.7–15.4)
WBC: 4.9 10*3/uL (ref 3.4–10.8)

## 2022-03-06 NOTE — Progress Notes (Signed)
Subjective:  Christina Velasquez is a 56 y.o. female who presents for Chief Complaint  Patient presents with   Hypertension    Tuesday- started spotting and then had blood clots and got weak, had procedure with gel injected in knees.  Still bleeding off and on, but just felt off. Yesterday took bp and and it was high 160 but then had some pressure point and breathing excerises and bp has come down and feeling normal now. Just scared and wanted to come in     Here for elevated BP and not feeling well.    She has a history of hypertension, hyperlipidemia, morbid obesity, sleep disturbance, chronic venous insufficiency, chronic pain, arthritis, venous stasis of lower extremities.  She notes that she has not been feeling that great the last few days.   She just had gel injections in knees 3 days ago by orthopedics.  Prior had steroid shots that didn't help her pains.  She reports that she needs knee replacements in both knees but the gel injections are a measure to give some temporary relief.  She is not sure if it is coincidental but later the day she had the gel injections she started feeling not great.  During the same day that she had the injection she had some vaginal clots, uterine bleeding a little bit.  She notes that her last formal period was 5 years ago but she still gets an occasional bleeding like a menstrual period.  She is well past due on Pap smear.  She is compliant with her blood pressure medicines.  Says she has not been feeling well she has checked her blood pressure a few times and got a slightly elevated reading such as what we got today in our office.  She denies chest pain, palpitations, edema, dizziness.  She denies numbness, weakness, confusion, slurred speech, visual changes.  She does not sleep well.  She gets on average 4 hours of sleep per night.  She says her mind does not shut down.  No prior sleep study.  She only gets good sleep if she uses CBD oil which she has been  using lately or a glass of wine.  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  She also does not get very good sleep because of her arthritis and chronic pains.  She sleeps in a recliner inclined.  No calf pain or swelling.  No shortness of breath.  No history of blood clots.  No other aggravating or relieving factors.    No other c/o.  The following portions of the patient's history were reviewed and updated as appropriate: allergies, current medications, past family history, past medical history, past social history, past surgical history and problem list.  ROS Otherwise as in subjective above  Past Medical History:  Diagnosis Date   Allergy    Cervical cancer ([place])    cervix   Colon polyp 03/29/2011   sessile polyp   Dyslipidemia    Encounter for routine gynecological examination    Dr. Corinna Capra   H/O mammogram    yearly per gynecology, last one 2014   Hypertension    Obesity    Current Outpatient Medications on File Prior to Visit  Medication Sig Dispense Refill   aspirin 81 MG EC tablet Take 1 tablet (81 mg total) by mouth daily. Swallow whole. 90 tablet 3   furosemide (LASIX) 20 MG tablet TAKE 1 TABLET BY MOUTH EVERY DAY 90 tablet 0   losartan (  COZAAR) 100 MG tablet Take 1 tablet (100 mg total) by mouth daily. 90 tablet 3   metoprolol succinate (TOPROL-XL) 50 MG 24 hr tablet Take 1 tablet (50 mg total) by mouth daily. Take with or immediately following a meal. 90 tablet 3   Multiple Vitamins-Minerals (MULTIVITAMIN WITH MINERALS) tablet Take 1 tablet by mouth daily.     rosuvastatin (CRESTOR) 20 MG tablet TAKE 1 TABLET BY MOUTH EVERYDAY AT BEDTIME 90 tablet 3   No current facility-administered medications on file prior to visit.     Objective: BP 130/90   Pulse 85   Wt (!) 348 lb (157.9 kg)   BMI 55.33 kg/m   BP Readings from Last 3 Encounters:  03/06/22 130/90  08/26/21 134/84  07/16/21 110/70   General appearance: alert, no distress, well developed,  well nourished HEENT: normocephalic, sclerae anicteric, conjunctiva pink and moist Oral cavity: MMM, no lesions Neck: supple, no lymphadenopathy, no thyromegaly, no masses, no JVD or bruits Heart: RRR, normal S1, S2, no murmurs Lungs: CTA bilaterally, no wheezes, rhonchi, or rales Pulses: 2+ radial pulses, 2+ pedal pulses, normal cap refill Ext: 1+ nonpitting lower extremity edema bilaterally Neuro: CN II through XII intact, nonfocal exam, negative Romberg  EKG reviewed     Assessment: Encounter Diagnoses  Name Primary?   Other fatigue Yes   Essential hypertension    Chronic pain of both knees    Sleep disturbances    Abnormal uterine bleeding      Plan: We discussed her acute symptoms and concerns.  EKG reviewed.  We will get some baseline labs today given the symptoms.  She will monitor her blood pressures at home since we only have a few recent readings that are abnormal and are slightly abnormal.  She has a wrist cuff but she is getting a new larger cuff today in the mail.  We discussed if any signs of MI or stroke as discussed today to call 911  Given the abnormal uterine bleeding and the fact that she is past due on Pap, referral to gynecology  She likely has sleep apnea but could have other sleep issues as well.  She works in [employer] and is on the go all the time and does not get adequate rest first and foremost.  Referral to neurology for further evaluation and treatment recommendations regarding sleep  She is compliant with blood pressure medicine but has not been compliant with screenings and other wellness measures  Hypertension-continue current medication.  If readings are remaining elevated over the next 2 weeks we may need to make some adjustments in her medication.  I reviewed cardiology notes from her visit in December 2022 for recheck on blood pressure and some tachycardia.  [name] was seen today for hypertension.  Diagnoses and all orders for this  visit:  Other fatigue -     EKG 12-Lead -     Basic metabolic panel -     CBC with Differential/Platelet  Essential hypertension -     EKG 12-Lead -     Basic metabolic panel -     CBC with Differential/Platelet  Chronic pain of both knees  Sleep disturbances -     Ambulatory referral to Neurology  Abnormal uterine bleeding -     Ambulatory referral to Gynecology    Follow up: pending labs, referrals

## 2022-03-08 ENCOUNTER — Emergency Department: Payer: BC Managed Care – PPO

## 2022-03-08 ENCOUNTER — Emergency Department
Admission: EM | Admit: 2022-03-08 | Discharge: 2022-03-09 | Disposition: A | Discharge status: Home / Self Care | Payer: BC Managed Care – PPO | Attending: Emergency Medicine | Admitting: Emergency Medicine

## 2022-03-08 ENCOUNTER — Other Ambulatory Visit: Payer: Self-pay

## 2022-03-08 DIAGNOSIS — R7989 Other specified abnormal findings of blood chemistry: Secondary | ICD-10-CM

## 2022-03-08 DIAGNOSIS — I10 Essential (primary) hypertension: Secondary | ICD-10-CM | POA: Diagnosis not present

## 2022-03-08 DIAGNOSIS — R42 Dizziness and giddiness: Secondary | ICD-10-CM | POA: Diagnosis not present

## 2022-03-08 DIAGNOSIS — N950 Postmenopausal bleeding: Secondary | ICD-10-CM | POA: Diagnosis not present

## 2022-03-08 DIAGNOSIS — R531 Weakness: Secondary | ICD-10-CM | POA: Diagnosis not present

## 2022-03-08 DIAGNOSIS — R9389 Abnormal findings on diagnostic imaging of other specified body structures: Secondary | ICD-10-CM

## 2022-03-08 DIAGNOSIS — R7401 Elevation of levels of liver transaminase levels: Secondary | ICD-10-CM | POA: Diagnosis not present

## 2022-03-08 DIAGNOSIS — R079 Chest pain, unspecified: Secondary | ICD-10-CM | POA: Diagnosis not present

## 2022-03-08 DIAGNOSIS — R946 Abnormal results of thyroid function studies: Secondary | ICD-10-CM | POA: Insufficient documentation

## 2022-03-08 LAB — HEPATIC FUNCTION PANEL
ALT: 49 U/L — ABNORMAL HIGH (ref 0–44)
AST: 58 U/L — ABNORMAL HIGH (ref 15–41)
Albumin: 4.4 g/dL (ref 3.5–5.0)
Alkaline Phosphatase: 73 U/L (ref 38–126)
Bilirubin, Direct: 0.2 mg/dL (ref 0.0–0.2)
Indirect Bilirubin: 0.9 mg/dL (ref 0.3–0.9)
Total Bilirubin: 1.1 mg/dL (ref 0.3–1.2)
Total Protein: 8 g/dL (ref 6.5–8.1)

## 2022-03-08 LAB — BASIC METABOLIC PANEL
Anion gap: 12 (ref 5–15)
BUN: 15 mg/dL (ref 6–20)
CO2: 25 mmol/L (ref 22–32)
Calcium: 9.5 mg/dL (ref 8.9–10.3)
Chloride: 103 mmol/L (ref 98–111)
Creatinine, Ser: 0.8 mg/dL (ref 0.44–1.00)
GFR, Estimated: >60 mL/min (ref >60)
Glucose, Bld: 102 mg/dL — ABNORMAL HIGH (ref 70–99)
Potassium: 3.6 mmol/L (ref 3.5–5.1)
Sodium: 140 mmol/L (ref 135–145)

## 2022-03-08 LAB — TROPONIN I (HIGH SENSITIVITY)
Troponin I (High Sensitivity): 5 ng/L (ref <18)
Troponin I (High Sensitivity): 6 ng/L (ref <18)

## 2022-03-08 LAB — CBC
HCT: 42.2 % (ref 36.0–46.0)
Hemoglobin: 13.7 g/dL (ref 12.0–15.0)
MCH: 32.9 pg (ref 26.0–34.0)
MCHC: 32.5 g/dL (ref 30.0–36.0)
MCV: 101.4 fL — ABNORMAL HIGH (ref 80.0–100.0)
Platelets: 214 10*3/uL (ref 150–400)
RBC: 4.16 MIL/uL (ref 3.87–5.11)
RDW: 12.7 % (ref 11.5–15.5)
WBC: 6.7 10*3/uL (ref 4.0–10.5)
nRBC: 0 % (ref 0.0–0.2)

## 2022-03-08 LAB — T4, FREE: Free T4: 0.85 ng/dL (ref 0.61–1.12)

## 2022-03-08 LAB — TSH: TSH: 5.536 u[IU]/mL — ABNORMAL HIGH (ref 0.350–4.500)

## 2022-03-08 LAB — MAGNESIUM: Magnesium: 2.1 mg/dL (ref 1.7–2.4)

## 2022-03-08 MED ORDER — LACTATED RINGERS IV BOLUS
1000.0000 mL | Freq: Once | INTRAVENOUS | Status: AC
  Administered 2022-03-09: 1000 mL via INTRAVENOUS

## 2022-03-08 NOTE — ED Provider Notes (Signed)
[facility] Provider Note    Event Date/Time   First MD Initiated Contact with Patient 03/08/22 2254     (approximate)   History   Weakness and Dizziness   HPI  Christina Velasquez is a 56 y.o. female  has a history of chronic back pain, hypertension, hyperlipidemia, morbid obesity, sleep disturbance, chronic venous insufficiency, chronic pain, arthritis, venous stasis of lower extremities who presents for evaluation of generalized weakness and palpitations, dizziness and some tightness in her left shoulder.  She states she has been feeling quite herself for several days after recently receiving an injection of gel into her knees for arthritis on 6/27.  She states that after receiving the injection she passed some blood clots from her vagina and this has not happened for over a year.  She did not see a gynecologist last year and has not seen one yet for her current symptoms.  She states that today she was going about her usual routine including taking her animals outside and she started feeling dizzy and palpitations.  She states that she is not sure if she overworked her left arm or not but that the pain and tightness in her left shoulder is worse when she is ranging her left upper extremity.  She states she had 3 glasses of wine last night which is not a typical of her.  She denies any other chest pain, change in her chronic back pain, cough, shortness of breath, abdominal pain, nausea, vomiting, diarrhea, constipation, urinary symptoms, earache or rash or focal extremity weakness numbness or tingling.  No tobacco abuse patient does state she has been taking CBD edibles for sleep.  No other clear acute sick symptoms.  No changes to her medications.  No other new supplements.      Physical Exam  Triage Vital Signs: ED Triage Vitals  Enc Vitals Group     BP 03/08/22 1905 (!) 149/78     Pulse Rate 03/08/22 1905 94     Resp 03/08/22 1905 18     Temp 03/08/22 1905  98.5 F (36.9 C)     Temp Source 03/08/22 1905 Oral     SpO2 03/08/22 1905 98 %     Weight 03/08/22 1904 (!) 340 lb (154.2 kg)     Height 03/08/22 1904 '5\' 8"'$  (1.727 m)     Head Circumference --      Peak Flow --      Pain Score 03/08/22 1922 2     Pain Loc --      Pain Edu? --      Excl. in [place]? --     Most recent vital signs: Vitals:   03/08/22 1905 03/09/22 0100  BP: (!) 149/78 131/69  Pulse: 94 80  Resp: 18 17  Temp: 98.5 F (36.9 C)   SpO2: 98% 100%    General: Awake, no distress.  CV:  Good peripheral perfusion.  2+ radial pulses. Resp:  Normal effort.  Clear bilaterally. Abd:  No distention.  Soft throughout Other:  Cranial nerves II through XII grossly intact.  No pronator drift.  No finger dysmetria.  Symmetric 5/5 strength of all extremities.  Sensation intact to light touch in all extremities.  Unremarkable unassisted gait.  Some mild lower extremity edema.   ED Results / Procedures / Treatments  Labs (all labs ordered are listed, but only abnormal results are displayed) Labs Reviewed  BASIC METABOLIC PANEL - Abnormal; Notable for the following components:  Result Value   Glucose, Bld 102 (*)    All other components within normal limits  CBC - Abnormal; Notable for the following components:   MCV 101.4 (*)    All other components within normal limits  URINALYSIS, COMPLETE (UACMP) WITH MICROSCOPIC - Abnormal; Notable for the following components:   Color, Urine YELLOW (*)    APPearance HAZY (*)    Hgb urine dipstick LARGE (*)    Bacteria, UA RARE (*)    All other components within normal limits  TSH - Abnormal; Notable for the following components:   TSH 5.536 (*)    All other components within normal limits  HEPATIC FUNCTION PANEL - Abnormal; Notable for the following components:   AST 58 (*)    ALT 49 (*)    All other components within normal limits  T4, FREE  MAGNESIUM  LIPASE, BLOOD  TROPONIN I (HIGH SENSITIVITY)  TROPONIN I (HIGH  SENSITIVITY)     EKG  ECG is markable sinus rhythm with a ventricular rate of 91, normal axis, nonspecific ST change in lead III without any other clear evidence of acute ischemia or significant arrhythmia.   RADIOLOGY Chest reviewed by myself shows no focal consoidation, effusion, edema, pneumothorax or other clear acute thoracic process. I also reviewed radiology interpretation and agree with findings described.  Pelvic ultrasound my interpretation shows what appears to be thickened endometrial stripe without other acute process.  Also reviewed radiology interpretation and agree to findings of same.  PROCEDURES:  Critical Care performed: No  .1-3 Lead EKG Interpretation  Performed by: Lucrezia Starch, MD Authorized by: Lucrezia Starch, MD     Interpretation: normal     ECG rate assessment: normal     Rhythm: sinus rhythm     Ectopy: none     Conduction: normal     The patient is on the cardiac monitor to evaluate for evidence of arrhythmia and/or significant heart rate changes.   MEDICATIONS ORDERED IN ED: Medications  lactated ringers bolus 1,000 mL (1,000 mLs Intravenous New Bag/Given 03/09/22 0046)     IMPRESSION / MDM / ASSESSMENT AND PLAN / ED COURSE  I reviewed the triage vital signs and the nursing notes. Patient's presentation is most consistent with acute presentation with potential threat to life or bodily function.                               Differential diagnosis includes, but is not limited to dehydration, symptoms related to heat exposure, anemia from abnormal vaginal bleeding although patient states she has not had bleeding in nearly a week, infectious process, arrhythmia, possible anginal equivalent.  No focal deficits at this time to suggest a CVA.  Thyroid derangement.  ECG is markable sinus rhythm with a ventricular rate of 91, normal axis, nonspecific ST change in lead III without any other clear evidence of acute ischemia or significant  arrhythmia.  Nonelevated troponin x2 is not suggestive of ACS.  CBC without leukocytosis or acute anemia.  BMP without any significant lecture light or metabolic derangements.  Hepatic function panel shows very mild transaminitis without evidence of cholestatic process.  Magnesium is 2.1. lipase WNL not suggestive of pancreatitis.  Hepatic function panel shows mild transaminitis improved from 7 months ago with AST of 50 and ALT of 49 but no other significant derangements.  Free T4 is WNL but TSH slightly elevated  Chest reviewed by myself shows no  focal consoidation, effusion, edema, pneumothorax or other clear acute thoracic process. I also reviewed radiology interpretation and agree with findings described.  Pelvic ultrasound my interpretation shows what appears to be thickened endometrial stripe without other acute process.  Also reviewed radiology interpretation and agree to findings of same.  Overall unclear etiology of patient's symptoms today.  Emphasized importance of following up her elevated TSH with her PCP as well as elevated LFTs which I suspect may be related to excessive alcohol use and possibly fatty liver.  Also discussed importance of close gynecology follow-up for further evaluation of her abnormal ultrasound and vaginal bleeding she states that she is already had a referral placed to her PCP.  At this point unclear exactly etiology of symptoms today although given low suspicion for immediate life-threatening process I think she is stable for discharge continue outpatient evaluation.  Discharged in stable condition.  Strict and precautions advised and discussed.      FINAL CLINICAL IMPRESSION(S) / ED DIAGNOSES   Final diagnoses:  Weakness  Dizziness  Thickened endometrium  Elevated TSH  Transaminitis     Rx / DC Orders   ED Discharge Orders     None        Note:  This document was prepared using Dragon voice recognition software and may include unintentional  dictation errors.   Lucrezia Starch, MD 03/09/22 [phone]

## 2022-03-08 NOTE — ED Triage Notes (Signed)
Pt presents to ER c/o weakness and dizziness that started today after working in yard.  Pt states she has not felt well all week, and was seen by her PCP and had blood work drawn and steroid injections in her knees.  Pt states after she took shower today, she was very shaky, and weak.  Pt states she did drink plenty of water today.  Pt is A&O x4 at this time in NAD in triage.

## 2022-03-09 LAB — URINALYSIS, COMPLETE (UACMP) WITH MICROSCOPIC
Bilirubin Urine: NEGATIVE
Glucose, UA: NEGATIVE mg/dL
Ketones, ur: NEGATIVE mg/dL
Leukocytes,Ua: NEGATIVE
Nitrite: NEGATIVE
Protein, ur: NEGATIVE mg/dL
Specific Gravity, Urine: 1.011 (ref 1.005–1.030)
pH: 5 (ref 5.0–8.0)

## 2022-03-09 LAB — LIPASE, BLOOD: Lipase: 33 U/L (ref 11–51)

## 2022-03-09 NOTE — Discharge Instructions (Addendum)
Your ultrasound today showed: IMPRESSION:  1. Endometrial stripe abnormally thickened measuring up to 15.2 mm.  In the setting of post-menopausal bleeding, endometrial sampling is  indicated to exclude carcinoma. If results are benign,  sonohysterogram should be considered for focal lesion work-up. (Ref:  Radiological Reasoning: Algorithmic Workup of Abnormal Vaginal  Bleeding with Endovaginal Sonography and Sonohysterography. AJR  2008; 211:H41-74).  2. Normal sonographic appearance of the uterus.  3. Nonvisualization of either ovary.  No adnexal mass or free fluid.

## 2022-04-02 DIAGNOSIS — N8502 Endometrial intraepithelial neoplasia [EIN]: Secondary | ICD-10-CM | POA: Diagnosis not present

## 2022-04-02 DIAGNOSIS — Z124 Encounter for screening for malignant neoplasm of cervix: Secondary | ICD-10-CM | POA: Diagnosis not present

## 2022-04-02 DIAGNOSIS — N950 Postmenopausal bleeding: Secondary | ICD-10-CM | POA: Diagnosis not present

## 2022-04-13 DIAGNOSIS — N8502 Endometrial intraepithelial neoplasia [EIN]: Secondary | ICD-10-CM | POA: Diagnosis not present

## 2022-04-14 ENCOUNTER — Other Ambulatory Visit: Payer: Self-pay | Admitting: Obstetrics and Gynecology

## 2022-04-14 NOTE — Progress Notes (Addendum)
Disregard note.

## 2022-04-20 ENCOUNTER — Other Ambulatory Visit: Payer: Self-pay | Admitting: Medical

## 2022-05-12 NOTE — H&P (Signed)
Christina Velasquez is a 56 y.o. 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 (COMPLEX HYPERPLASIA OF THE ENDOMETRIUM WITH CYTOLOGIC ATYPIA). P53 IMMUNOSTAIN IS NEGATIVE.    Pap wnl    Past Medical History:  has a past medical history of History of cancer, degenerative disc disease, Hyperlipidemia, and Hypertension.  Past Surgical History:  has a past surgical history that includes cervical cancer  and Cervical cone biopsy. Family History: family history includes No Known Problems in her father and mother. Social History:  reports that she has never smoked. She has never used smokeless tobacco. She reports current alcohol use. She reports that she does not currently use drugs. OB/GYN History:  OB History       Gravida  5   Para  2   Term  2   Preterm      AB  3   Living  2        SAB      IAB      Ectopic      Molar      Multiple      Live Births  2             Allergies: has No Known Allergies. Medications:   Current Outpatient Medications:    aspirin 81 MG EC tablet, Take by mouth, Disp: , Rfl:    FUROsemide (LASIX) 20 MG tablet, Take 20 mg by mouth once daily, Disp: , Rfl:    losartan (COZAAR) 100 MG tablet, Take 100 mg by mouth once daily, Disp: , Rfl:    metoprolol succinate (TOPROL-XL) 50 MG XL tablet, Take by mouth, Disp: , Rfl:    multivitamin with minerals tablet, Take 1 tablet by mouth once daily, Disp: , Rfl:    norethindrone-ethinyl estradiol (NORTREL 0.5/35) 0.5-35 mg-mcg tablet, Take 1 tab po q day unless bleeding starts then take 1 tab po BID x 3 days then back to 1 tab po q day, Disp: 28 tablet, Rfl: 11   rosuvastatin (CRESTOR) 20 MG tablet, Take 20 mg by mouth nightly, Disp: , Rfl:    metoprolol succinate (TOPROL-XL) 50 MG XL tablet, Take by mouth, Disp: , Rfl:    Review of Systems: General:                       No fatigue or weight loss Eyes:                           No vision changes Ears:                            No hearing difficulty Respiratory:                No cough or shortness of breath Pulmonary:                  No asthma or shortness of breath Cardiovascular:           No chest pain, palpitations, dyspnea on exertion Gastrointestinal:          No abdominal bloating, chronic diarrhea, constipations, masses, pain or hematochezia Genitourinary:             No hematuria, dysuria, abnormal vaginal discharge, pelvic pain, Menometrorrhagia Lymphatic:  No swollen lymph nodes Musculoskeletal:         No muscle weakness Neurologic:                  No extremity weakness, syncope, seizure disorder Psychiatric:                  No history of depression, delusions or suicidal/homicidal ideation      Exam:       Vitals:  05/12/22   BP: (!) 147/81  Pulse: 99      Body mass index is 51.54 kg/m.   WDWN white/ female in NAD   Lungs: CTA  CV : RRR without murmur   Breast: exam done in sitting and lying position : No dimpling or retraction, no dominant mass, no spontaneous discharge, no axillary adenopathy Neck:  no thyromegaly Abdomen: soft , no mass, normal active bowel sounds,  non-tender, no rebound tenderness Pelvic: tanner stage 5 ,  External genitalia: vulva /labia no lesions Urethra: no prolapse Vagina:  extra large speculum required normal physiologic d/c Cervix: no lesions, no cervical motion tenderness   Uterus: normal size shape and contour, non-tender Adnexa: no mass,  non-tender    Impression:    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   Caroline Sauger, MD

## 2022-05-13 ENCOUNTER — Encounter: Payer: Self-pay | Admitting: Internal Medicine

## 2022-05-20 ENCOUNTER — Encounter
Admission: RE | Admit: 2022-05-20 | Discharge: 2022-05-20 | Disposition: A | Discharge status: Home / Self Care | Payer: BC Managed Care – PPO | Source: Ambulatory Visit | Attending: Obstetrics and Gynecology | Admitting: Obstetrics and Gynecology

## 2022-05-20 ENCOUNTER — Other Ambulatory Visit: Payer: Self-pay

## 2022-05-20 ENCOUNTER — Encounter: Payer: Self-pay | Admitting: Obstetrics and Gynecology

## 2022-05-20 VITALS — Ht 68.0 in | Wt 340.0 lb

## 2022-05-20 DIAGNOSIS — Z01812 Encounter for preprocedural laboratory examination: Secondary | ICD-10-CM

## 2022-05-20 DIAGNOSIS — N939 Abnormal uterine and vaginal bleeding, unspecified: Secondary | ICD-10-CM

## 2022-05-20 HISTORY — DX: Other complications of anesthesia, initial encounter: T88.59XA

## 2022-05-20 HISTORY — DX: Personal history of other diseases of the musculoskeletal system and connective tissue: Z87.39

## 2022-05-20 HISTORY — DX: Unspecified osteoarthritis, unspecified site: M19.90

## 2022-05-20 NOTE — Progress Notes (Addendum)
Perioperative Services Pre-Admission/Anesthesia Testing    Date: 05/22/22  Name: Christina Velasquez MRN:   [id number]  Re: Anesthesia preferences for upcoming surgery  Planned Surgical Procedure(s):    Case: [id number] Date/Time: 05/29/22 0715   Procedures:      FRACTIONAL DILATATION AND CURETTAGE /HYSTEROSCOPY     INTRAUTERINE DEVICE (IUD) INSERTION MIRENA   Anesthesia type: Choice   Pre-op diagnosis: Atypical Endometrial Hyperplasia   Location: [facility] OR ROOM 05 / [place] [facility]   Surgeons: Schermerhorn, Gwen Her, MD   Clinical Notes:  Patient is scheduled for the above procedure on 05/29/2022 with Dr. Laverta Baltimore, MD. During PAT interview, patient may need interviewing RN aware of the fact that she did not wish to receive general anesthesia for her upcoming surgery.  Patient with multiple reservations including:  Required to sleep sitting up in a recliner for "years" Delayed emergence from general anesthesia in the past Episodes of "not feeling right" following general anesthesia in the past  Patient has discussed the above concerns with her primary attending surgeon.  Surgeon advised patient to discuss concerns with anesthesia team.  PAT RN referred patient to me for preoperative evaluation and clearance prior to upcoming procedure.  Past medical history significant for HTN, HLD, OA, and DDD. She is a former smoker; quit in 09/1993.  Denied history of substance abuse.  Patient has a BMI of 51.70 kg/m.  Based on reports, patient likely to have undiagnosed OSAH syndrome and is scheduled for polysomnography testing in October of this year. Denies previous issues with intubation secondary to airway anatomy and cervical mobility. It does not appear as if patient has ever undergone any type of anesthesia services here at [facility].  Impression and Plan:  [place] is scheduled for an elective D&C and IUD insertion. Patient  with concerns regarding anesthesia as outlined above. Case discussed with attending anesthesiologist Erenest Rasher, MD) who advised that case under neuraxial anesthesia could be accommodated given strong patient preference. I sent patient a detailed MyChart message detailing some of the more basic information regarding neuraxial anesthesia. Sent in writing well before the procedure to allow patient time to think of questions and further concerns. She was made aware of the following:  Spinal anesthesia is certainly an option, however she was made aware that she would have to be here longer postoperatively. Discussed that anesthesia will effectively "numb" the patient from the umbilicus and extend distally. Advised patient that it will take several hours for the anesthesia to wear off. Reminded her that she will need someone with her for at least 24 hours after surgery no matter what type of anesthesia she received. Wanted to ensure someone readily available in the setting of neuraxial course as it may cause altered sensation in the lower extremities, which could precipitate a fall.  Discussed that BMI can make it more difficult for the anesthesia team to successfully place the catheter required to deliver the medications needed for neuraxial anesthesia. Reviewed that in this case, there has to be a backup plan in place, which would usually be general anesthesia.  Reviewed that positioning for the procedure on the operating table requires that patient be able to maintain a supine position while awake. If she is unable to remain supine without experiencing shortness of breath, this again would require general anesthesia as a back up plan.   I also called patient and left her a message asking her to review the information sent in  MyChart and to either reply or return a call to the office. I have not heard anything to day. I will be available when patient comes in for her preoperative labs to field any questions  should she have any. She was made aware that she will meet with her anesthesia team (MD and CRNA) on the day of her procedure, at which time she could discuss preferences and overall concerns further.   Honor Loh, MSN, APRN, FNP-C, CEN [facility]  Peri-operative Services Nurse Practitioner Phone: [phone] 05/22/22 10:18 AM  NOTE: This note has been prepared using Dragon dictation software. Despite my best ability to proofread, there is always the potential that unintentional transcriptional errors may still occur from this process.

## 2022-05-20 NOTE — Patient Instructions (Addendum)
Your procedure is scheduled on: May 29, 2022 FRIDAY Report to the Registration Desk on the 1st floor of the [facility]. To find out your arrival time, please call [phone] between [place] on: THURSDAY May 28, 2022 If your arrival time is 6:00 am, do not arrive prior to that time as the [place] entrance doors do not open until 6:00 am.  REMEMBER: Instructions that are not followed completely may result in serious medical risk, up to and including death; or upon the discretion of your surgeon and anesthesiologist your surgery may need to be rescheduled.  DO NOT EAT OR DRINK ANYTHING after midnight the night before surgery.   TAKE THESE MEDICATIONS THE MORNING OF SURGERY WITH A SIP OF WATER: METOPROLOL ROSUVASTATIN   One week prior to surgery: Stop Anti-inflammatories (NSAIDS) such as Advil, Aleve, Ibuprofen, Motrin, Naproxen, Naprosyn and ASPIRIN OR Aspirin based products such as Excedrin, Goodys Powder, BC Powder. Stop ANY OVER THE COUNTER supplements until after surgery. You may however, continue to take Tylenol if needed for pain up until the day of surgery.  No Alcohol for 24 hours before or after surgery.  No Smoking including e-cigarettes for 24 hours prior to surgery.  No chewable tobacco products for at least 6 hours prior to surgery.  No nicotine patches on the day of surgery.  Do not use any "recreational" drugs for at least a week prior to your surgery.  Please be advised that the combination of cocaine and anesthesia may have negative outcomes, up to and including death. If you test positive for cocaine, your surgery will be cancelled.  On the morning of surgery brush your teeth with toothpaste and water, you may rinse your mouth with mouthwash if you wish. Do not swallow any toothpaste or mouthwash.  SHOWER MORNING OF SURGERY  Do not wear jewelry, make-up, hairpins, clips or nail polish.  Do not wear lotions, powders, or perfumes OR  DEODORANT  Do not shave body from the neck down 48 hours prior to surgery just in case you cut yourself which could leave a site for infection.  Also, freshly shaved skin may become irritated if using the CHG soap.  Contact lenses, hearing aids and dentures may not be worn into surgery.  Do not bring valuables to the hospital. [facility] is not responsible for any missing/lost belongings or valuables.   Notify your doctor if there is any change in your medical condition (cold, fever, infection).  Wear comfortable clothing (specific to your surgery type) to the hospital.  After surgery, you can help prevent lung complications by doing breathing exercises.  Take deep breaths and cough every 1-2 hours. Your doctor may order a device called an Incentive Spirometer to help you take deep breaths. When coughing or sneezing, hold a pillow firmly against your incision with both hands. This is called "splinting." Doing this helps protect your incision. It also decreases belly discomfort.  If you are being discharged the day of surgery, you will not be allowed to drive home. You will need a responsible adult (18 years or older) to drive you home and stay with you that night.   If you are taking public transportation, you will need to have a responsible adult (18 years or older) with you. Please confirm with your physician that it is acceptable to use public transportation.   Please call the [place] Dept. at [phone] if you have any questions about these instructions.  Surgery Visitation Policy:  Patients undergoing a surgery or procedure may have two family members or support persons with them as long as the person is not COVID-19 positive or experiencing its symptoms.

## 2022-05-21 ENCOUNTER — Encounter: Payer: Self-pay | Admitting: Urgent Care

## 2022-05-22 ENCOUNTER — Inpatient Hospital Stay: Admission: RE | Admit: 2022-05-22 | Payer: BC Managed Care – PPO | Source: Ambulatory Visit

## 2022-05-26 ENCOUNTER — Ambulatory Visit: Payer: BC Managed Care – PPO | Admitting: Medical

## 2022-05-26 ENCOUNTER — Ambulatory Visit
Admission: RE | Admit: 2022-05-26 | Discharge: 2022-05-26 | Disposition: A | Discharge status: Home / Self Care | Payer: BC Managed Care – PPO | Source: Ambulatory Visit | Attending: Medical | Admitting: Medical

## 2022-05-26 ENCOUNTER — Encounter
Admission: RE | Admit: 2022-05-26 | Discharge: 2022-05-26 | Disposition: A | Discharge status: Home / Self Care | Payer: BC Managed Care – PPO | Source: Ambulatory Visit | Attending: Obstetrics and Gynecology | Admitting: Obstetrics and Gynecology

## 2022-05-26 ENCOUNTER — Other Ambulatory Visit: Payer: Self-pay | Admitting: Internal Medicine

## 2022-05-26 VITALS — BP 120/78 | HR 67 | Temp 97.7°F | Wt 339.2 lb

## 2022-05-26 DIAGNOSIS — M5441 Lumbago with sciatica, right side: Secondary | ICD-10-CM | POA: Diagnosis not present

## 2022-05-26 DIAGNOSIS — M545 Low back pain, unspecified: Secondary | ICD-10-CM

## 2022-05-26 DIAGNOSIS — R21 Rash and other nonspecific skin eruption: Secondary | ICD-10-CM | POA: Diagnosis not present

## 2022-05-26 DIAGNOSIS — Z01812 Encounter for preprocedural laboratory examination: Secondary | ICD-10-CM | POA: Insufficient documentation

## 2022-05-26 DIAGNOSIS — M79604 Pain in right leg: Secondary | ICD-10-CM

## 2022-05-26 DIAGNOSIS — N939 Abnormal uterine and vaginal bleeding, unspecified: Secondary | ICD-10-CM

## 2022-05-26 DIAGNOSIS — Z8541 Personal history of malignant neoplasm of cervix uteri: Secondary | ICD-10-CM | POA: Diagnosis not present

## 2022-05-26 DIAGNOSIS — Z87891 Personal history of nicotine dependence: Secondary | ICD-10-CM | POA: Diagnosis not present

## 2022-05-26 DIAGNOSIS — Z6841 Body Mass Index (BMI) 40.0 and over, adult: Secondary | ICD-10-CM | POA: Diagnosis not present

## 2022-05-26 DIAGNOSIS — E785 Hyperlipidemia, unspecified: Secondary | ICD-10-CM | POA: Diagnosis not present

## 2022-05-26 DIAGNOSIS — G8929 Other chronic pain: Secondary | ICD-10-CM

## 2022-05-26 DIAGNOSIS — N950 Postmenopausal bleeding: Secondary | ICD-10-CM | POA: Diagnosis not present

## 2022-05-26 DIAGNOSIS — N8502 Endometrial intraepithelial neoplasia [EIN]: Secondary | ICD-10-CM | POA: Diagnosis not present

## 2022-05-26 DIAGNOSIS — I872 Venous insufficiency (chronic) (peripheral): Secondary | ICD-10-CM

## 2022-05-26 DIAGNOSIS — Z01818 Encounter for other preprocedural examination: Secondary | ICD-10-CM

## 2022-05-26 DIAGNOSIS — M5410 Radiculopathy, site unspecified: Secondary | ICD-10-CM

## 2022-05-26 DIAGNOSIS — I10 Essential (primary) hypertension: Secondary | ICD-10-CM | POA: Diagnosis not present

## 2022-05-26 LAB — BASIC METABOLIC PANEL
Anion gap: 9 (ref 5–15)
BUN: 13 mg/dL (ref 6–20)
CO2: 25 mmol/L (ref 22–32)
Calcium: 9.5 mg/dL (ref 8.9–10.3)
Chloride: 105 mmol/L (ref 98–111)
Creatinine, Ser: 0.65 mg/dL (ref 0.44–1.00)
GFR, Estimated: >60 mL/min (ref >60)
Glucose, Bld: 105 mg/dL — ABNORMAL HIGH (ref 70–99)
Potassium: 3.9 mmol/L (ref 3.5–5.1)
Sodium: 139 mmol/L (ref 135–145)

## 2022-05-26 LAB — CBC
HCT: 38.8 % (ref 36.0–46.0)
Hemoglobin: 12.5 g/dL (ref 12.0–15.0)
MCH: 32.1 pg (ref 26.0–34.0)
MCHC: 32.2 g/dL (ref 30.0–36.0)
MCV: 99.5 fL (ref 80.0–100.0)
Platelets: 239 10*3/uL (ref 150–400)
RBC: 3.9 MIL/uL (ref 3.87–5.11)
RDW: 12.4 % (ref 11.5–15.5)
WBC: 5.4 10*3/uL (ref 4.0–10.5)
nRBC: 0 % (ref 0.0–0.2)

## 2022-05-26 LAB — TYPE AND SCREEN
ABO/RH(D): O POS
Antibody Screen: NEGATIVE

## 2022-05-26 LAB — D-DIMER, QUANTITATIVE: D-DIMER: 0.53 mg/L FEU — ABNORMAL HIGH (ref 0.00–0.49)

## 2022-05-26 MED ORDER — HYDROCODONE-ACETAMINOPHEN 7.5-325 MG PO TABS: 1.0000 | ORAL_TABLET | Freq: Two times a day (BID) | ORAL | 0 refills | Status: DC | PRN

## 2022-05-26 NOTE — Progress Notes (Unsigned)
U

## 2022-05-26 NOTE — Progress Notes (Signed)
Subjective:  Christina Velasquez is a 56 y.o. female who presents for Chief Complaint  Patient presents with   sharp pain    Sharp pain going down right leg calf. Having some tenderness at the groin area and then will go down leg. Today its in the calf area and painful. Having surgery Friday so want to make sure its not a Blood clot     Here for leg pain and back pain.  She has a history of chronic back pain ongoing.  She also has a history of leg pain.   Had meetings all day yesterday and had pain all day yesterday, throbbing pain.  When up and moving is a little less painful.  No recent injury or trauma.  No prior blood clot.   No leg swelling or discoloration.  No recent long travel.    Having gynecological surgery in 3 days, having D&C and biopsy given endometrial hyperplasia.    She has been doing swimming for exercise but is breaking out in a rash that she attributes to the chlorine.  Has rash all over her legs, arms, torso.  This happened last fall at a pool out of town as well.  No other aggravating or relieving factors.    No other c/o.   The following portions of the patient's history were reviewed and updated as appropriate: allergies, current medications, past family history, past medical history, past social history, past surgical history and problem list.  ROS Otherwise as in subjective above  Objective: BP 120/78   Pulse 67   Temp 97.7 F (36.5 C)   Wt (!) 339 lb 3.2 oz (153.9 kg)   LMP 03/18/2022   BMI 51.58 kg/m   Wt Readings from Last 3 Encounters:  05/26/22 (!) 339 lb 3.2 oz (153.9 kg)  05/20/22 (!) 340 lb (154.2 kg)  03/08/22 (!) 340 lb (154.2 kg)    General appearance: alert, no distress, well developed, well nourished Back: Nontender but limited range of motion due to pain  abdomen: +bs, soft, non tender, non distended, no masses, no hepatomegaly, no splenomegaly Tender in the right leg medial and posterior hamstring and lower leg medial posterior pain  with palpation, no obvious swelling, no obvious asymmetry, no palpable cords, negative Homans, range of motion does not seem to change the level of pain but range of motion is relatively full Skin: Several scattered patches of erythema and slightly papular lesions with erythema on arms and legs that she notes are new since being in the pool recently Pulses: 2+ radial pulses, 2+ pedal pulses, normal cap refill Ext: no edema   Assessment: Encounter Diagnoses  Name Primary?   Right leg pain Yes   Chronic bilateral low back pain with right-sided sciatica    Rash    Venous stasis dermatitis of both lower extremities    Radicular pain of lower extremity    Lumbar pain    Chronic venous insufficiency    Abnormal uterine bleeding      Plan: We discussed her symptoms and concerns.  We discussed possible differential.  No obvious concern for DVT.  However we will do a D-dimer to help further evaluate.  We discussed the potential for ultrasound depending on the D-dimer.  I suspect this is a sciatica lumbar spine issue.  She has history of chronic pain and her symptoms and exam suggest more of a radicular pain currently.  Given her surgery coming up in a few days we will avoid anti-inflammatories or  steroid.  Can use the pain medicine below short-term as needed.  Discussed risk and benefits and proper use of medication.  She will likely need to go back to see orthopedics which she had been seeing already.  She has known right knee problems, lumbar spine issues but is taking care of the gynecological issues first.  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  Moisturize daily with lotion.  Christina Velasquez was seen today for sharp pain.  Diagnoses and all orders for this visit:  Right leg pain -     D-dimer, quantitative  Chronic bilateral low back pain with right-sided sciatica -     D-dimer,  quantitative  Rash  Venous stasis dermatitis of both lower extremities  Radicular pain of lower extremity  Lumbar pain  Chronic venous insufficiency  Abnormal uterine bleeding  Other orders -     HYDROcodone-acetaminophen (NORCO) 7.5-325 MG tablet; Take 1 tablet by mouth 2 (two) times daily as needed for moderate pain.    Follow up: Pending lab

## 2022-05-29 ENCOUNTER — Other Ambulatory Visit: Payer: Self-pay

## 2022-05-29 ENCOUNTER — Encounter
Admission: RE | Disposition: A | Discharge status: Home / Self Care | Payer: Self-pay | Source: Home / Self Care | Attending: Obstetrics and Gynecology

## 2022-05-29 ENCOUNTER — Encounter: Payer: Self-pay | Admitting: Obstetrics and Gynecology

## 2022-05-29 ENCOUNTER — Ambulatory Visit
Admission: RE | Admit: 2022-05-29 | Discharge: 2022-05-29 | Disposition: A | Discharge status: Home / Self Care | Payer: BC Managed Care – PPO | Attending: Obstetrics and Gynecology | Admitting: Obstetrics and Gynecology

## 2022-05-29 ENCOUNTER — Ambulatory Visit: Payer: BC Managed Care – PPO | Admitting: Urgent Care

## 2022-05-29 DIAGNOSIS — Z8541 Personal history of malignant neoplasm of cervix uteri: Secondary | ICD-10-CM | POA: Insufficient documentation

## 2022-05-29 DIAGNOSIS — Z01812 Encounter for preprocedural laboratory examination: Secondary | ICD-10-CM

## 2022-05-29 DIAGNOSIS — Z87891 Personal history of nicotine dependence: Secondary | ICD-10-CM | POA: Insufficient documentation

## 2022-05-29 DIAGNOSIS — N8502 Endometrial intraepithelial neoplasia [EIN]: Secondary | ICD-10-CM | POA: Insufficient documentation

## 2022-05-29 DIAGNOSIS — N939 Abnormal uterine and vaginal bleeding, unspecified: Secondary | ICD-10-CM

## 2022-05-29 DIAGNOSIS — E785 Hyperlipidemia, unspecified: Secondary | ICD-10-CM | POA: Insufficient documentation

## 2022-05-29 DIAGNOSIS — N950 Postmenopausal bleeding: Secondary | ICD-10-CM | POA: Insufficient documentation

## 2022-05-29 DIAGNOSIS — F419 Anxiety disorder, unspecified: Secondary | ICD-10-CM | POA: Diagnosis not present

## 2022-05-29 DIAGNOSIS — Z6841 Body Mass Index (BMI) 40.0 and over, adult: Secondary | ICD-10-CM | POA: Insufficient documentation

## 2022-05-29 DIAGNOSIS — I10 Essential (primary) hypertension: Secondary | ICD-10-CM | POA: Insufficient documentation

## 2022-05-29 DIAGNOSIS — Z01818 Encounter for other preprocedural examination: Secondary | ICD-10-CM

## 2022-05-29 DIAGNOSIS — N8500 Endometrial hyperplasia, unspecified: Secondary | ICD-10-CM | POA: Diagnosis not present

## 2022-05-29 HISTORY — PX: INTRAUTERINE DEVICE (IUD) INSERTION: SHX5877

## 2022-05-29 HISTORY — PX: HYSTEROSCOPY WITH D & C: SHX1775

## 2022-05-29 LAB — POCT PREGNANCY, URINE: Preg Test, Ur: NEGATIVE

## 2022-05-29 LAB — ABO/RH: ABO/RH(D): O POS

## 2022-05-29 SURGERY — DILATATION AND CURETTAGE /HYSTEROSCOPY
Anesthesia: General

## 2022-05-29 MED ORDER — MIDAZOLAM HCL 2 MG/2ML IJ SOLN
INTRAMUSCULAR | Status: DC | PRN
  Administered 2022-05-29: 2 mg via INTRAVENOUS

## 2022-05-29 MED ORDER — CEFAZOLIN SODIUM-DEXTROSE 2-4 GM/100ML-% IV SOLN
INTRAVENOUS | Status: AC
  Filled 2022-05-29: qty 100

## 2022-05-29 MED ORDER — ACETAMINOPHEN 500 MG PO TABS: 1000.0000 mg | ORAL_TABLET | ORAL | Status: AC

## 2022-05-29 MED ORDER — CEFAZOLIN SODIUM-DEXTROSE 2-4 GM/100ML-% IV SOLN
2.0000 g | Freq: Once | INTRAVENOUS | Status: AC
  Administered 2022-05-29: 2 g via INTRAVENOUS

## 2022-05-29 MED ORDER — 0.9 % SODIUM CHLORIDE (POUR BTL) OPTIME
TOPICAL | Status: DC | PRN
  Administered 2022-05-29: 150 mL

## 2022-05-29 MED ORDER — FENTANYL CITRATE (PF) 100 MCG/2ML IJ SOLN
INTRAMUSCULAR | Status: AC
  Filled 2022-05-29: qty 2

## 2022-05-29 MED ORDER — ACETAMINOPHEN 500 MG PO TABS
ORAL_TABLET | ORAL | Status: AC
  Administered 2022-05-29: 1000 mg via ORAL
  Filled 2022-05-29: qty 2

## 2022-05-29 MED ORDER — CHLORHEXIDINE GLUCONATE 0.12 % MT SOLN: 15.0000 mL | Freq: Once | OROMUCOSAL | Status: AC

## 2022-05-29 MED ORDER — DEXMEDETOMIDINE HCL IN NACL 200 MCG/50ML IV SOLN
INTRAVENOUS | Status: DC | PRN
  Administered 2022-05-29: 8 ug via INTRAVENOUS

## 2022-05-29 MED ORDER — PROPOFOL 10 MG/ML IV BOLUS
INTRAVENOUS | Status: DC | PRN
  Administered 2022-05-29: 220 mg via INTRAVENOUS

## 2022-05-29 MED ORDER — POVIDONE-IODINE 10 % EX SWAB: 2.0000 | Freq: Once | CUTANEOUS | Status: DC

## 2022-05-29 MED ORDER — GABAPENTIN 300 MG PO CAPS: 300.0000 mg | ORAL_CAPSULE | ORAL | Status: AC

## 2022-05-29 MED ORDER — DEXAMETHASONE SODIUM PHOSPHATE 10 MG/ML IJ SOLN
INTRAMUSCULAR | Status: DC | PRN
  Administered 2022-05-29: 10 mg via INTRAVENOUS

## 2022-05-29 MED ORDER — MIDAZOLAM HCL 2 MG/2ML IJ SOLN
INTRAMUSCULAR | Status: AC
  Filled 2022-05-29: qty 2

## 2022-05-29 MED ORDER — FERRIC SUBSULFATE 259 MG/GM EX SOLN
CUTANEOUS | Status: AC
  Filled 2022-05-29: qty 8

## 2022-05-29 MED ORDER — FAMOTIDINE 20 MG PO TABS
ORAL_TABLET | ORAL | Status: AC
  Administered 2022-05-29: 20 mg via ORAL
  Filled 2022-05-29: qty 1

## 2022-05-29 MED ORDER — LACTATED RINGERS IV SOLN
INTRAVENOUS | Status: DC
Start: 2022-05-29 — End: 2022-05-29

## 2022-05-29 MED ORDER — FENTANYL CITRATE (PF) 100 MCG/2ML IJ SOLN
INTRAMUSCULAR | Status: DC | PRN
  Administered 2022-05-29 (×2): 50 ug via INTRAVENOUS

## 2022-05-29 MED ORDER — ONDANSETRON HCL 4 MG/2ML IJ SOLN: 4.0000 mg | Freq: Once | INTRAMUSCULAR | Status: DC | PRN

## 2022-05-29 MED ORDER — ORAL CARE MOUTH RINSE: 15.0000 mL | Freq: Once | OROMUCOSAL | Status: AC

## 2022-05-29 MED ORDER — SUGAMMADEX SODIUM 200 MG/2ML IV SOLN
INTRAVENOUS | Status: DC | PRN
  Administered 2022-05-29: 200 mg via INTRAVENOUS

## 2022-05-29 MED ORDER — FAMOTIDINE 20 MG PO TABS: 20.0000 mg | ORAL_TABLET | Freq: Once | ORAL | Status: AC

## 2022-05-29 MED ORDER — SILVER NITRATE-POT NITRATE 75-25 % EX MISC
CUTANEOUS | Status: AC
  Filled 2022-05-29: qty 10

## 2022-05-29 MED ORDER — PROPOFOL 1000 MG/100ML IV EMUL
INTRAVENOUS | Status: AC
  Filled 2022-05-29: qty 100

## 2022-05-29 MED ORDER — ONDANSETRON HCL 4 MG/2ML IJ SOLN
INTRAMUSCULAR | Status: DC | PRN
  Administered 2022-05-29: 4 mg via INTRAVENOUS

## 2022-05-29 MED ORDER — FENTANYL CITRATE (PF) 100 MCG/2ML IJ SOLN: 25.0000 ug | INTRAMUSCULAR | Status: DC | PRN

## 2022-05-29 MED ORDER — LIDOCAINE HCL (CARDIAC) PF 100 MG/5ML IV SOSY
PREFILLED_SYRINGE | INTRAVENOUS | Status: DC | PRN
  Administered 2022-05-29: 100 mg via INTRAVENOUS

## 2022-05-29 MED ORDER — LEVONORGESTREL 20 MCG/DAY IU IUD
1.0000 | INTRAUTERINE_SYSTEM | Freq: Once | INTRAUTERINE | Status: AC
  Administered 2022-05-29: 1 via INTRAUTERINE
  Filled 2022-05-29: qty 1

## 2022-05-29 MED ORDER — CHLORHEXIDINE GLUCONATE 0.12 % MT SOLN
OROMUCOSAL | Status: AC
  Administered 2022-05-29: 15 mL via OROMUCOSAL
  Filled 2022-05-29: qty 15

## 2022-05-29 MED ORDER — ROCURONIUM BROMIDE 100 MG/10ML IV SOLN
INTRAVENOUS | Status: DC | PRN
  Administered 2022-05-29: 60 mg via INTRAVENOUS

## 2022-05-29 MED ORDER — GABAPENTIN 300 MG PO CAPS
ORAL_CAPSULE | ORAL | Status: AC
  Administered 2022-05-29: 300 mg via ORAL
  Filled 2022-05-29: qty 1

## 2022-05-29 SURGICAL SUPPLY — 19 items
BAG PRESSURE INF REUSE 1000 (BAG) ×1 IMPLANT
DRSG TELFA 3X8 NADH STRL (GAUZE/BANDAGES/DRESSINGS) ×1 IMPLANT
GLOVE SURG SYN 8.0 (GLOVE) ×1 IMPLANT
GLOVE SURG SYN 8.0 PF PI (GLOVE) ×1 IMPLANT
GOWN STRL REUS W/ TWL LRG LVL3 (GOWN DISPOSABLE) ×1 IMPLANT
GOWN STRL REUS W/ TWL XL LVL3 (GOWN DISPOSABLE) ×1 IMPLANT
GOWN STRL REUS W/TWL LRG LVL3 (GOWN DISPOSABLE) ×1
GOWN STRL REUS W/TWL XL LVL3 (GOWN DISPOSABLE) ×1
IV NS 1000ML (IV SOLUTION) ×1
IV NS 1000ML BAXH (IV SOLUTION) ×1 IMPLANT
KIT TURNOVER CYSTO (KITS) ×1 IMPLANT
MANIFOLD NEPTUNE II (INSTRUMENTS) ×1 IMPLANT
MIRENA IMPLANT
PACK DNC HYST (MISCELLANEOUS) ×1 IMPLANT
PAD OB MATERNITY 4.3X12.25 (PERSONAL CARE ITEMS) ×1 IMPLANT
PAD PREP 24X41 OB/GYN DISP (PERSONAL CARE ITEMS) ×1 IMPLANT
SCRUB CHG 4% DYNA-HEX 4OZ (MISCELLANEOUS) ×1 IMPLANT
SEAL ROD LENS SCOPE MYOSURE (ABLATOR) ×1 IMPLANT
TOWEL OR 17X26 4PK STRL BLUE (TOWEL DISPOSABLE) ×1 IMPLANT

## 2022-05-29 NOTE — Anesthesia Preprocedure Evaluation (Signed)
Anesthesia Evaluation  Patient identified by MRN, date of birth, ID band Patient awake    Reviewed: Allergy & Precautions, NPO status , Patient's Chart, lab work & pertinent test results  Airway Mallampati: II  TM Distance: >3 FB Neck ROM: full    Dental  (+) Teeth Intact   Pulmonary neg pulmonary ROS, Patient abstained from smoking., former smoker,    Pulmonary exam normal breath sounds clear to auscultation       Cardiovascular Exercise Tolerance: Good hypertension, Pt. on medications negative cardio ROS Normal cardiovascular exam Rhythm:Regular Rate:Normal     Neuro/Psych Anxiety negative neurological ROS  negative psych ROS   GI/Hepatic negative GI ROS, Neg liver ROS,   Endo/Other  negative endocrine ROSMorbid obesity  Renal/GU negative Renal ROS  negative genitourinary   Musculoskeletal   Abdominal (+) + obese,   Peds negative pediatric ROS (+)  Hematology negative hematology ROS (+)   Anesthesia Other Findings Past Medical History: No date: Allergy No date: Arthritis No date: Cervical cancer (HCC) No date: Complication of anesthesia     Comment:  a.) delayed emergence; b.) "didn't feel right" after GA No date: Dyslipidemia No date: Encounter for routine gynecological examination     Comment:  Dr. Corinna Capra No date: H/O degenerative disc disease No date: H/O mammogram     Comment:  yearly per gynecology, last one 2014 No date: Hypertension No date: Obesity 03/18/2011: Tubular adenoma of colon  Past Surgical History: 09/07/2012: COLONOSCOPY     Comment:  polyp,Dr. Verl Blalock, repeat 2018 No date: conization cervix     Comment:  x 4 No date: DILATION AND CURETTAGE OF UTERUS No date: TONSILLECTOMY No date: WISDOM TOOTH EXTRACTION  BMI    Body Mass Index: 51.58 kg/m      Reproductive/Obstetrics negative OB ROS                             Anesthesia  Physical Anesthesia Plan  ASA: 3  Anesthesia Plan: General   Post-op Pain Management:    Induction: Intravenous  PONV Risk Score and Plan: 1 and Ondansetron and Dexamethasone  Airway Management Planned: Oral ETT  Additional Equipment:   Intra-op Plan:   Post-operative Plan: Extubation in OR  Informed Consent: I have reviewed the patients History and Physical, chart, labs and discussed the procedure including the risks, benefits and alternatives for the proposed anesthesia with the patient or authorized representative who has indicated his/her understanding and acceptance.     Dental Advisory Given  Plan Discussed with: CRNA and Surgeon  Anesthesia Plan Comments:         Anesthesia Quick Evaluation

## 2022-05-29 NOTE — Anesthesia Postprocedure Evaluation (Signed)
Anesthesia Post Note  Patient: Christina Velasquez  Procedure(s) Performed: FRACTIONAL DILATATION AND CURETTAGE INTRAUTERINE DEVICE (IUD) INSERTION MIRENA  Patient location during evaluation: PACU Anesthesia Type: General Level of consciousness: awake and oriented Pain management: pain level controlled Vital Signs Assessment: post-procedure vital signs reviewed and stable Respiratory status: spontaneous breathing and respiratory function stable Cardiovascular status: stable Anesthetic complications: no   No notable events documented.   Last Vitals:  Vitals:   05/29/22 0841 05/29/22 0853  BP: 138/75 (!) 151/73  Pulse: 62 61  Resp: 14 14  Temp: (!) 36.3 C (!) 36.3 C  SpO2: 100% 98%    Last Pain:  Vitals:   05/29/22 0853  TempSrc: Temporal  PainSc: 0-No pain                 VAN STAVEREN,Jac Romulus

## 2022-05-29 NOTE — Progress Notes (Signed)
Here for Fx D+C and mirena IUD . LAbs reviewed . All questions answered . Proceed

## 2022-05-29 NOTE — Op Note (Signed)
Christina Velasquez, Christina Velasquez MEDICAL RECORD NO: [id number] ACCOUNT NO: [id number] DATE OF BIRTH: Nov 17, 1965 FACILITY: [facility] LOCATION: [facility] PHYSICIAN: Boykin Nearing, MD  Operative Report   DATE OF PROCEDURE: 05/29/2022  PREOPERATIVE DIAGNOSIS:  Endometrial intraepithelial neoplasia.  POSTOPERATIVE DIAGNOSIS:  Endometrial intraepithelial neoplasia.  PROCEDURE: 1.  Fractional dilation and curettage. 2.  Mirena IUD placement.  ANESTHESIA:  General endotracheal anesthesia.  SURGEON:  Boykin Nearing, MD  INDICATIONS:  A 56 year old gravida 63, para 2 patient with postmenopausal bleeding who underwent endometrial biopsy in the office showing complex hyperplasia of the endometrium with atypia, consistent with atypical endometrial intraepithelial neoplasia.  DESCRIPTION OF PROCEDURE:  After adequate general endotracheal anesthesia, the patient was placed in dorsal supine position with legs in the Hobson stirrups.  The patient's abdomen, perineum and vagina were prepped and draped in normal sterile fashion.   The patient did receive 2 grams of IV Ancef.  Timeout was performed.  A large speculum was placed into the vagina and the anterior cervix was grasped with a single tooth tenaculum.  Weighted speculum was then placed after removal of the speculum.  An  endocervical curettage was performed with scant tissue.  The cervix was then dilated to #20 Hanks dilator.  Endometrial curetting was performed with adequate removal of tissue.  Uterus sounded then to 10.5 cm and the Mirena IUD was placed without  difficulty and the strings were trimmed to 3 cm.  Good hemostasis was noted.  Of note, the patient's bladder was catheterized prior to commencement of the case, yielding 30 mL dark urine.  There were no complications.  INTRAOPERATIVE FLUIDS:  500 mL.  BLOOD LOSS:  2 mL.  URINE OUTPUT:  30 mL.  The patient was taken to recovery room in good condition.   SHW D: 05/29/2022  8:20:45 am T: 05/29/2022 9:12:00 am  JOB: [id number]/ [id number]

## 2022-05-29 NOTE — Transfer of Care (Signed)
Immediate Anesthesia Transfer of Care Note  Patient: Christina Velasquez  Procedure(s) Performed: FRACTIONAL DILATATION AND CURETTAGE /HYSTEROSCOPY INTRAUTERINE DEVICE (IUD) INSERTION MIRENA  Patient Location: PACU  Anesthesia Type:General  Level of Consciousness: awake  Airway & Oxygen Therapy: Patient Spontanous Breathing  Post-op Assessment: Report given to RN and Post -op Vital signs reviewed and stable  Post vital signs: Reviewed and stable  Last Vitals:  Vitals Value Taken Time  BP 132/68 05/29/22 0820  Temp    Pulse 80 05/29/22 0822  Resp 18 05/29/22 0822  SpO2 95 % 05/29/22 0822  Vitals shown include unvalidated device data.  Last Pain:  Vitals:   05/29/22 0615  TempSrc: Temporal  PainSc: 8          Complications: No notable events documented.

## 2022-05-29 NOTE — Brief Op Note (Signed)
05/29/2022  8:10 AM  PATIENT:  Christina Velasquez  56 y.o. female  PRE-OPERATIVE DIAGNOSIS:  Atypical Endometrial Hyperplasia  POST-OPERATIVE DIAGNOSIS:  Atypical Endometrial Hyperplasia  PROCEDURE:  Fractional D+C, Mirena placement   SURGEON:  Surgeon(s) and Role:    * Oleg Oleson, Gwen Her, MD - Primary  PHYSICIAN ASSISTANT:   ASSISTANTS: none   ANESTHESIA:   general  EBL:  2 mL IOF 500 cc UO 30 cc  BLOOD ADMINISTERED:none  DRAINS: none   LOCAL MEDICATIONS USED:  NONE  SPECIMEN:  Source of Specimen:  ecc and endometrial curetting   DISPOSITION OF SPECIMEN:  PATHOLOGY  COUNTS:  YES  TOURNIQUET:  * No tourniquets in log *  DICTATION: .Other Dictation: Dictation Number verbal  PLAN OF CARE: Discharge to home after PACU  PATIENT DISPOSITION:  PACU - hemodynamically stable.   Delay start of Pharmacological VTE agent (>24hrs) due to surgical blood loss or risk of bleeding: not applicable

## 2022-05-29 NOTE — Anesthesia Procedure Notes (Signed)
Procedure Name: Intubation Date/Time: 05/29/2022 7:39 AM  Performed by: Biagio Borg, CRNAPre-anesthesia Checklist: Patient identified, Emergency Drugs available, Suction available and Patient being monitored Patient Re-evaluated:Patient Re-evaluated prior to induction Oxygen Delivery Method: Circle system utilized Preoxygenation: Pre-oxygenation with 100% oxygen Induction Type: IV induction Ventilation: Mask ventilation without difficulty Laryngoscope Size: McGraph and 3 Grade View: Grade I Tube type: Oral Number of attempts: 1 Airway Equipment and Method: Stylet and Oral airway Placement Confirmation: ETT inserted through vocal cords under direct vision, positive ETCO2 and breath sounds checked- equal and bilateral Secured at: 22 cm Tube secured with: Tape Dental Injury: Teeth and Oropharynx as per pre-operative assessment

## 2022-05-29 NOTE — Discharge Instructions (Signed)

## 2022-06-01 ENCOUNTER — Encounter: Payer: Self-pay | Admitting: Obstetrics and Gynecology

## 2022-06-01 LAB — SURGICAL PATHOLOGY

## 2022-06-16 ENCOUNTER — Encounter: Payer: Self-pay | Admitting: Internal Medicine

## 2022-06-16 DIAGNOSIS — M25551 Pain in right hip: Secondary | ICD-10-CM | POA: Diagnosis not present

## 2022-06-16 DIAGNOSIS — M5136 Other intervertebral disc degeneration, lumbar region: Secondary | ICD-10-CM | POA: Diagnosis not present

## 2022-06-16 DIAGNOSIS — M47816 Spondylosis without myelopathy or radiculopathy, lumbar region: Secondary | ICD-10-CM | POA: Diagnosis not present

## 2022-06-16 DIAGNOSIS — M47818 Spondylosis without myelopathy or radiculopathy, sacral and sacrococcygeal region: Secondary | ICD-10-CM | POA: Diagnosis not present

## 2022-07-02 ENCOUNTER — Encounter: Payer: Self-pay | Admitting: Obstetrics and Gynecology

## 2022-07-17 ENCOUNTER — Other Ambulatory Visit: Payer: Self-pay | Admitting: Medical

## 2022-07-21 ENCOUNTER — Other Ambulatory Visit: Payer: Self-pay | Admitting: Medical

## 2022-08-20 ENCOUNTER — Other Ambulatory Visit: Payer: Self-pay | Admitting: Medical

## 2022-10-14 ENCOUNTER — Other Ambulatory Visit: Payer: Self-pay | Admitting: Medical

## 2022-10-15 ENCOUNTER — Other Ambulatory Visit: Payer: Self-pay | Admitting: Medical

## 2022-10-15 NOTE — Telephone Encounter (Signed)
Left detailed message for pt that she is overdue for med check. Sent message back in November she read but didn't schedule and sent message and called her yesterday. I will deny meds until she schedules

## 2022-10-28 ENCOUNTER — Encounter: Payer: Self-pay | Admitting: Medical

## 2022-10-28 ENCOUNTER — Ambulatory Visit: Payer: BC Managed Care – PPO | Admitting: Medical

## 2022-10-28 VITALS — BP 130/72 | HR 87 | Temp 99.6°F | Resp 16 | Wt 350.4 lb

## 2022-10-28 DIAGNOSIS — R7989 Other specified abnormal findings of blood chemistry: Secondary | ICD-10-CM | POA: Diagnosis not present

## 2022-10-28 DIAGNOSIS — E782 Mixed hyperlipidemia: Secondary | ICD-10-CM | POA: Diagnosis not present

## 2022-10-28 DIAGNOSIS — R7301 Impaired fasting glucose: Secondary | ICD-10-CM | POA: Diagnosis not present

## 2022-10-28 DIAGNOSIS — Z6841 Body Mass Index (BMI) 40.0 and over, adult: Secondary | ICD-10-CM

## 2022-10-28 DIAGNOSIS — I10 Essential (primary) hypertension: Secondary | ICD-10-CM | POA: Diagnosis not present

## 2022-10-28 DIAGNOSIS — I872 Venous insufficiency (chronic) (peripheral): Secondary | ICD-10-CM | POA: Diagnosis not present

## 2022-10-28 DIAGNOSIS — Z131 Encounter for screening for diabetes mellitus: Secondary | ICD-10-CM | POA: Diagnosis not present

## 2022-10-28 MED ORDER — ROSUVASTATIN CALCIUM 20 MG PO TABS: 20.0000 mg | ORAL_TABLET | Freq: Every day | ORAL | 3 refills | Status: DC

## 2022-10-28 MED ORDER — FUROSEMIDE 20 MG PO TABS: 20.0000 mg | ORAL_TABLET | Freq: Every day | ORAL | 1 refills | Status: DC

## 2022-10-28 MED ORDER — METOPROLOL SUCCINATE ER 50 MG PO TB24: ORAL_TABLET | ORAL | 3 refills | Status: DC

## 2022-10-28 MED ORDER — ASPIRIN 81 MG PO TBEC: 81.0000 mg | DELAYED_RELEASE_TABLET | Freq: Every day | ORAL | 3 refills | Status: DC

## 2022-10-28 MED ORDER — LOSARTAN POTASSIUM 100 MG PO TABS: 100.0000 mg | ORAL_TABLET | Freq: Every day | ORAL | 3 refills | Status: DC

## 2022-10-28 NOTE — Patient Instructions (Signed)
Check insurance coverage for Zepbound or [id number] injectable weight loss medication

## 2022-10-28 NOTE — Progress Notes (Signed)
Subjective:  Christina Velasquez is a 57 y.o. female who presents for Chief Complaint  Patient presents with   Follow-up    Medication refill. Needs refills.      Here for med check.  Hypertension-compliant with losartan 100 mg daily, Toprol-XL 50 mg daily without complaint.  He uses Lasix daily for swelling and blood pressure  Hyperlipidemia-compliant with Crestor 20 mg daily and aspirin 81 mg daily  Also here to recheck on thyroid lab that was abnormal at a prior visit last year.  She is seeing orthopedics about her hip, ongoing pains, has been doing some physical therapy  She is leaving for [place] this week and needs to make sure she has enough supply as she is already out of some medications  No other aggravating or relieving factors.    No other c/o.  Past Medical History:  Diagnosis Date   Allergy    Arthritis    Cervical cancer ([place])    Complication of anesthesia    a.) delayed emergence; b.) "didn't feel right" after GA   Dyslipidemia    Encounter for routine gynecological examination    Dr. Corinna Velasquez   H/O degenerative disc disease    H/O mammogram    yearly per gynecology, last one 2014   Hypertension    Obesity    Tubular adenoma of colon 03/18/2011   Current Outpatient Medications on File Prior to Visit  Medication Sig Dispense Refill   HYDROcodone-acetaminophen (NORCO) 7.5-325 MG tablet Take 1 tablet by mouth 2 (two) times daily as needed for moderate pain. 12 tablet 0   Multiple Vitamins-Minerals (MULTIVITAMIN WITH MINERALS) tablet Take 1 tablet by mouth daily.     OVER THE COUNTER MEDICATION Take 1 capsule by mouth daily. LIVER SUPPORT     QUERCETIN PO Take 800 mg by mouth daily.  BROMINE 165 MG ALSO IN THIS MED     S-ACETYL GLUTATHIONE PO Take 1 Capful by mouth daily.     No current facility-administered medications on file prior to visit.     The following portions of the patient's history were reviewed and updated as appropriate: allergies, current  medications, past family history, past medical history, past social history, past surgical history and problem list.  ROS Otherwise as in subjective above  Objective: BP 130/72   Pulse 87   Temp 99.6 F (37.6 C) (Tympanic)   Resp 16   Wt (!) 350 lb 6.4 oz (158.9 kg)   SpO2 98% Comment: room air  BMI 53.28 kg/m   General appearance: alert, no distress, well developed, well nourished Neck: supple, no lymphadenopathy, no thyromegaly, no masses Heart: RRR, normal S1, S2, no murmurs Lungs: CTA bilaterally, no wheezes, rhonchi, or rales Pulses: 2+ radial pulses, 2+ pedal pulses, normal cap refill Ext: no edema   Assessment: Encounter Diagnoses  Name Primary?   Essential hypertension Yes   Chronic venous insufficiency    Mixed hyperlipidemia    Screening for diabetes mellitus    Impaired fasting blood sugar    Abnormal thyroid blood test    BMI 50.0-59.9, adult ([place])      Plan: Hypertension-continue current medication, updated labs today  Hyperlipidemia-continue current medications updated labs today  Prior abnormal thyroid blood test-updated labs today  Impaired fasting glucose-updated labs today as she has been prediabetic  Obesity-her joint pains and hip pains limit her activity but discussed the need to try to find a better way to get some weight off through healthy diet and exercise.  I recommended we consider Wegovy or Zepbound to help with the efforts.  She will check her insurance for coverage for this  Vaccine counseling-due for Tdap and shingles.  She will consider both vaccines   Christina Velasquez was seen today for follow-up.  Diagnoses and all orders for this visit:  Essential hypertension  Chronic venous insufficiency  Mixed hyperlipidemia -     Lipid panel -     Comprehensive metabolic panel  Screening for diabetes mellitus -     Hemoglobin A1c  Impaired fasting blood sugar -     Hemoglobin A1c  Abnormal thyroid blood test -     TSH  BMI  50.0-59.9, adult (HCC)  Other orders -     rosuvastatin (CRESTOR) 20 MG tablet; Take 1 tablet (20 mg total) by mouth daily. -     metoprolol succinate (TOPROL-XL) 50 MG 24 hr tablet; TAKE 1 TABLET BY MOUTH EVERY DAY WITH OR IMMEDIATELY FOLLOWING A MEAL -     losartan (COZAAR) 100 MG tablet; Take 1 tablet (100 mg total) by mouth daily. -     furosemide (LASIX) 20 MG tablet; Take 1 tablet (20 mg total) by mouth daily. -     aspirin EC 81 MG tablet; Take 1 tablet (81 mg total) by mouth daily. Swallow whole.    Follow up: pending labs

## 2022-10-29 ENCOUNTER — Other Ambulatory Visit: Payer: Self-pay | Admitting: Medical

## 2022-10-29 LAB — COMPREHENSIVE METABOLIC PANEL
ALT: 33 IU/L — ABNORMAL HIGH (ref 0–32)
AST: 29 IU/L (ref 0–40)
Albumin/Globulin Ratio: 1.6 (ref 1.2–2.2)
Albumin: 4.2 g/dL (ref 3.8–4.9)
Alkaline Phosphatase: 88 IU/L (ref 44–121)
BUN/Creatinine Ratio: 12 (ref 9–23)
BUN: 9 mg/dL (ref 6–24)
Bilirubin Total: 0.5 mg/dL (ref 0.0–1.2)
CO2: 22 mmol/L (ref 20–29)
Calcium: 9 mg/dL (ref 8.7–10.2)
Chloride: 105 mmol/L (ref 96–106)
Creatinine, Ser: 0.76 mg/dL (ref 0.57–1.00)
Globulin, Total: 2.6 g/dL (ref 1.5–4.5)
Glucose: 107 mg/dL — ABNORMAL HIGH (ref 70–99)
Potassium: 4.2 mmol/L (ref 3.5–5.2)
Sodium: 141 mmol/L (ref 134–144)
Total Protein: 6.8 g/dL (ref 6.0–8.5)
eGFR: 91 mL/min/{1.73_m2} (ref >59)

## 2022-10-29 LAB — LIPID PANEL
Chol/HDL Ratio: 2 ratio (ref 0.0–4.4)
Cholesterol, Total: 147 mg/dL (ref 100–199)
HDL: 74 mg/dL (ref >39)
LDL Chol Calc (NIH): 60 mg/dL (ref 0–99)
Triglycerides: 62 mg/dL (ref 0–149)
VLDL Cholesterol Cal: 13 mg/dL (ref 5–40)

## 2022-10-29 LAB — HEMOGLOBIN A1C
Est. average glucose Bld gHb Est-mCnc: 111 mg/dL
Hgb A1c MFr Bld: 5.5 % (ref 4.8–5.6)

## 2022-10-29 LAB — TSH: TSH: 2.25 u[IU]/mL (ref 0.450–4.500)

## 2022-10-29 NOTE — Progress Notes (Signed)
Results sent through MyChart

## 2023-05-14 ENCOUNTER — Other Ambulatory Visit: Payer: Self-pay | Admitting: Medical

## 2023-08-13 ENCOUNTER — Other Ambulatory Visit: Payer: Self-pay | Admitting: Medical

## 2023-10-01 ENCOUNTER — Encounter: Payer: Self-pay | Admitting: Internal Medicine

## 2023-10-01 ENCOUNTER — Telehealth: Payer: Self-pay | Admitting: Medical

## 2023-10-01 ENCOUNTER — Other Ambulatory Visit: Payer: Self-pay | Admitting: Medical

## 2023-10-01 NOTE — Telephone Encounter (Signed)
Pt is going to be OOT until March and scheduled med check for March 12th  Needs refills on   Metoprolol and losartan   [facility]

## 2023-10-04 MED ORDER — LOSARTAN POTASSIUM 100 MG PO TABS: 100.0000 mg | ORAL_TABLET | Freq: Every day | ORAL | 0 refills | Status: DC

## 2023-10-04 MED ORDER — METOPROLOL SUCCINATE ER 50 MG PO TB24: ORAL_TABLET | ORAL | 0 refills | Status: DC

## 2023-10-04 NOTE — Telephone Encounter (Signed)
refilled

## 2023-11-12 ENCOUNTER — Other Ambulatory Visit: Payer: Self-pay | Admitting: Medical

## 2023-11-17 ENCOUNTER — Encounter: Payer: BC Managed Care – PPO | Admitting: Medical

## 2023-12-17 ENCOUNTER — Encounter: Admitting: Medical

## 2023-12-30 ENCOUNTER — Other Ambulatory Visit: Payer: Self-pay | Admitting: Medical

## 2023-12-31 ENCOUNTER — Ambulatory Visit: Admitting: Medical

## 2023-12-31 ENCOUNTER — Encounter: Payer: Self-pay | Admitting: Medical

## 2023-12-31 VITALS — BP 128/70 | HR 97 | Wt 360.0 lb

## 2023-12-31 DIAGNOSIS — J4520 Mild intermittent asthma, uncomplicated: Secondary | ICD-10-CM

## 2023-12-31 DIAGNOSIS — L989 Disorder of the skin and subcutaneous tissue, unspecified: Secondary | ICD-10-CM | POA: Diagnosis not present

## 2023-12-31 DIAGNOSIS — Z7185 Encounter for immunization safety counseling: Secondary | ICD-10-CM

## 2023-12-31 DIAGNOSIS — I872 Venous insufficiency (chronic) (peripheral): Secondary | ICD-10-CM | POA: Diagnosis not present

## 2023-12-31 DIAGNOSIS — G8929 Other chronic pain: Secondary | ICD-10-CM

## 2023-12-31 DIAGNOSIS — M25561 Pain in right knee: Secondary | ICD-10-CM

## 2023-12-31 DIAGNOSIS — M25562 Pain in left knee: Secondary | ICD-10-CM

## 2023-12-31 DIAGNOSIS — J301 Allergic rhinitis due to pollen: Secondary | ICD-10-CM

## 2023-12-31 DIAGNOSIS — R7989 Other specified abnormal findings of blood chemistry: Secondary | ICD-10-CM

## 2023-12-31 DIAGNOSIS — Z1211 Encounter for screening for malignant neoplasm of colon: Secondary | ICD-10-CM

## 2023-12-31 DIAGNOSIS — R7301 Impaired fasting glucose: Secondary | ICD-10-CM

## 2023-12-31 DIAGNOSIS — I10 Essential (primary) hypertension: Secondary | ICD-10-CM

## 2023-12-31 DIAGNOSIS — E782 Mixed hyperlipidemia: Secondary | ICD-10-CM

## 2023-12-31 DIAGNOSIS — Z6841 Body Mass Index (BMI) 40.0 and over, adult: Secondary | ICD-10-CM

## 2023-12-31 LAB — POCT URINALYSIS DIP (PROADVANTAGE DEVICE)
Bilirubin, UA: NEGATIVE
Blood, UA: NEGATIVE
Glucose, UA: NEGATIVE mg/dL
Ketones, POC UA: NEGATIVE mg/dL
Leukocytes, UA: NEGATIVE
Nitrite, UA: NEGATIVE
Protein Ur, POC: NEGATIVE mg/dL
Specific Gravity, Urine: 1.005
Urobilinogen, Ur: 0.2
pH, UA: 7.5 (ref 5.0–8.0)

## 2023-12-31 MED ORDER — AIRSUPRA 90-80 MCG/ACT IN AERO: 2.0000 | INHALATION_SPRAY | RESPIRATORY_TRACT | 1 refills | Status: AC

## 2023-12-31 MED ORDER — METOPROLOL SUCCINATE ER 50 MG PO TB24: ORAL_TABLET | ORAL | 3 refills | Status: AC

## 2023-12-31 MED ORDER — ASPIRIN 81 MG PO TBEC: 81.0000 mg | DELAYED_RELEASE_TABLET | Freq: Every day | ORAL | 3 refills | Status: AC

## 2023-12-31 MED ORDER — FUROSEMIDE 20 MG PO TABS: 20.0000 mg | ORAL_TABLET | Freq: Every day | ORAL | 3 refills | Status: AC

## 2023-12-31 MED ORDER — LOSARTAN POTASSIUM 100 MG PO TABS: 100.0000 mg | ORAL_TABLET | Freq: Every day | ORAL | 3 refills | Status: AC

## 2023-12-31 MED ORDER — ROSUVASTATIN CALCIUM 20 MG PO TABS: 20.0000 mg | ORAL_TABLET | Freq: Every day | ORAL | 3 refills | Status: AC

## 2023-12-31 NOTE — Addendum Note (Signed)
 Addended by: Claudene Crystal on: 12/31/2023 10:29 AM   Modules accepted: Orders

## 2023-12-31 NOTE — Addendum Note (Signed)
 Addended by: Eddye Goodie on: 12/31/2023 12:11 PM   Modules accepted: Orders

## 2023-12-31 NOTE — Progress Notes (Addendum)
 Subjective:  Christina Velasquez is a 58 y.o. female who presents for Chief Complaint  Patient presents with   Follow-up    Fasting med check.      Here for med check.  Last labs over a year ago.  Fasting today.  Hypertension-compliant with losartan  and Toprol   Edema-compliant with Lasix   Hyperlipidemia-compliant with Crestor  and aspirin  daily  Recently she has been under a lot of problems with stuffy nose and allergies.  She has history of asthma as well but has not needed inhaler necessarily but would like to have one on hand  She was only scheduled to have a colonoscopy twice this past year  but had  to cancel  She had gynecological surgery this past year.  She is due to go back to see gynecologist for Pap smear  No other aggravating or relieving factors.    No other c/o.  Past Medical History:  Diagnosis Date   Allergy    Arthritis    Cervical cancer ([facility])    Complication of anesthesia    a.) delayed emergence; b.) "didn't feel right" after GA   Dyslipidemia    Encounter for routine gynecological examination    Dr. Asencion Blacksmith   H/O degenerative disc disease    H/O mammogram    yearly per gynecology, last one 2014   Hypertension    Obesity    Tubular adenoma of colon 03/18/2011   Current Outpatient Medications on File Prior to Visit  Medication Sig Dispense Refill   Multiple Vitamins-Minerals (MULTIVITAMIN WITH MINERALS) tablet Take 1 tablet by mouth daily.     OVER THE COUNTER MEDICATION Take 1 capsule by mouth daily. LIVER SUPPORT     QUERCETIN PO Take 800 mg by mouth daily.  BROMINE 165 MG ALSO IN THIS MED     S-ACETYL GLUTATHIONE PO Take 1 Capful by mouth daily.     No current facility-administered medications on file prior to visit.     The following portions of the patient's history were reviewed and updated as appropriate: allergies, current medications, past family history, past medical history, past social history, past surgical history and problem  list.  ROS Otherwise as in subjective above    Objective: BP 128/70   Pulse 97   Wt (!) 360 lb (163.3 kg)   BMI 54.74 kg/m   General appearance: alert, no distress, well developed, well nourished Skin: Scattered freckles, she has several lesions on her left and right upper chest as well as forearms and lower legs suggestive of possible actinic keratoses Oral cavity: MMM, no lesions Neck: supple, no lymphadenopathy, no thyromegaly, no masses, no bruits Heart: RRR, normal S1, S2, no murmurs Lungs: CTA bilaterally, no wheezes, rhonchi, or rales Abdomen: +bs, soft, non tender, non distended, no masses, no hepatomegaly, no splenomegaly Pulses: 2+ radial pulses, 1+ pedal pulses, normal cap refill Ext: 1-2+ bilateral lower extremity nonpitting edema with chronic venous stasis dermatitis, hemosiderin staining of both lower legs   Assessment: Encounter Diagnoses  Name Primary?   Essential hypertension Yes   Venous stasis dermatitis of both lower extremities    Vaccine counseling    Skin lesions    Screen for colon cancer    Morbid obesity (HCC)    Impaired fasting blood sugar    Mixed hyperlipidemia    Chronic pain of both knees    Abnormal thyroid  blood test    Iron excess    Mild intermittent asthma, unspecified whether complicated    Allergic rhinitis due  to pollen, unspecified seasonality    BMI 50.0-59.9, adult (HCC)      Plan: Hypertension Continue losartan  100 mg daily Continue Toprol -XL 50 mg daily Continue Lasix  20 mg daily  Lower extremity edema, chronic Continue Lasix  20 mg daily  Hyperlipidemia Continue aspirin  81 mg daily and rosuvastatin  Crestor  20 mg daily  Impaired glucose-updated labs today  Chronic pain of both knees-uses cane, follow-up with orthopedics  Skin lesions, likely actinic keratoses of chest and arms, referral to dermatology  Screening for colon cancer-refer to gastroenterology.  She was on the schedule for colonoscopy this past year  but had to reschedule/cancel twice  She will follow-up with her gynecologist regarding screening, Pap smear, mammogram  History of abnormal thyroid  blood test-labs today  History of abnormal iron blood test-labs today  Morbid obesity-referral to [employer] weight loss study  Vaccine counseling-advised tetanus and shingles vaccine but she declines vaccines in general today.  Christina Velasquez was seen today for follow-up.  Diagnoses and all orders for this visit:  Essential hypertension -     CBC with Differential/Platelet -     Urinalysis, Routine w reflex microscopic  Venous stasis dermatitis of both lower extremities  Vaccine counseling  Skin lesions -     Ambulatory referral to Dermatology -     Iron, TIBC and Ferritin Panel  Screen for colon cancer -     Ambulatory referral to Gastroenterology  Morbid obesity (HCC)  Impaired fasting blood sugar -     Hemoglobin A1c  Mixed hyperlipidemia -     Comprehensive metabolic panel with GFR -     Lipid panel  Chronic pain of both knees  Abnormal thyroid  blood test -     TSH + free T4  Iron excess -     CBC with Differential/Platelet  Mild intermittent asthma, unspecified whether complicated  Allergic rhinitis due to pollen, unspecified seasonality  BMI 50.0-59.9, adult (HCC)  Other orders -     furosemide  (LASIX ) 20 MG tablet; Take 1 tablet (20 mg total) by mouth daily. -     losartan  (COZAAR ) 100 MG tablet; Take 1 tablet (100 mg total) by mouth daily. -     metoprolol  succinate (TOPROL -XL) 50 MG 24 hr tablet; TAKE 1 TABLET BY MOUTH EVERY DAY WITH OR IMMEDIATELY FOLLOWING A MEAL -     rosuvastatin  (CRESTOR ) 20 MG tablet; Take 1 tablet (20 mg total) by mouth daily. -     aspirin  EC 81 MG tablet; Take 1 tablet (81 mg total) by mouth daily. Swallow whole. -     Albuterol -Budesonide (AIRSUPRA) 90-80 MCG/ACT AERO; Inhale 2 puffs into the lungs every 4 (four) hours.  Spent > 45 minutes face to face with patient in discussion of  symptoms, evaluation, plan and recommendations.    Follow up: pending labs, referrals

## 2024-01-01 ENCOUNTER — Other Ambulatory Visit: Payer: Self-pay | Admitting: Medical

## 2024-01-01 LAB — COMPREHENSIVE METABOLIC PANEL WITH GFR
ALT: 31 IU/L (ref 0–32)
AST: 33 IU/L (ref 0–40)
Albumin: 4.6 g/dL (ref 3.8–4.9)
Alkaline Phosphatase: 95 IU/L (ref 44–121)
BUN/Creatinine Ratio: 17 (ref 9–23)
BUN: 12 mg/dL (ref 6–24)
Bilirubin Total: 0.8 mg/dL (ref 0.0–1.2)
CO2: 20 mmol/L (ref 20–29)
Calcium: 9.9 mg/dL (ref 8.7–10.2)
Chloride: 101 mmol/L (ref 96–106)
Creatinine, Ser: 0.72 mg/dL (ref 0.57–1.00)
Globulin, Total: 2.8 g/dL (ref 1.5–4.5)
Glucose: 107 mg/dL — ABNORMAL HIGH (ref 70–99)
Potassium: 4 mmol/L (ref 3.5–5.2)
Sodium: 141 mmol/L (ref 134–144)
Total Protein: 7.4 g/dL (ref 6.0–8.5)
eGFR: 96 mL/min/{1.73_m2} (ref >59)

## 2024-01-01 LAB — LIPID PANEL
Chol/HDL Ratio: 2.6 ratio (ref 0.0–4.4)
Cholesterol, Total: 164 mg/dL (ref 100–199)
HDL: 63 mg/dL (ref >39)
LDL Chol Calc (NIH): 76 mg/dL (ref 0–99)
Triglycerides: 149 mg/dL (ref 0–149)
VLDL Cholesterol Cal: 25 mg/dL (ref 5–40)

## 2024-01-01 LAB — CBC WITH DIFFERENTIAL/PLATELET
Basophils Absolute: 0.1 10*3/uL (ref 0.0–0.2)
Basos: 1 %
EOS (ABSOLUTE): 0.1 10*3/uL (ref 0.0–0.4)
Eos: 1 %
Hematocrit: 41.4 % (ref 34.0–46.6)
Hemoglobin: 13.7 g/dL (ref 11.1–15.9)
Immature Grans (Abs): 0 10*3/uL (ref 0.0–0.1)
Immature Granulocytes: 0 %
Lymphocytes Absolute: 0.8 10*3/uL (ref 0.7–3.1)
Lymphs: 15 %
MCH: 33.4 pg — ABNORMAL HIGH (ref 26.6–33.0)
MCHC: 33.1 g/dL (ref 31.5–35.7)
MCV: 101 fL — ABNORMAL HIGH (ref 79–97)
Monocytes Absolute: 0.3 10*3/uL (ref 0.1–0.9)
Monocytes: 6 %
Neutrophils Absolute: 4.2 10*3/uL (ref 1.4–7.0)
Neutrophils: 77 %
Platelets: 205 10*3/uL (ref 150–450)
RBC: 4.1 x10E6/uL (ref 3.77–5.28)
RDW: 12 % (ref 11.7–15.4)
WBC: 5.5 10*3/uL (ref 3.4–10.8)

## 2024-01-01 LAB — IRON,TIBC AND FERRITIN PANEL
Ferritin: 230 ng/mL — ABNORMAL HIGH (ref 15–150)
Iron Saturation: 25 % (ref 15–55)
Iron: 92 ug/dL (ref 27–159)
Total Iron Binding Capacity: 369 ug/dL (ref 250–450)
UIBC: 277 ug/dL (ref 131–425)

## 2024-01-01 LAB — TSH+FREE T4
Free T4: 1.4 ng/dL (ref 0.82–1.77)
TSH: 3.61 u[IU]/mL (ref 0.450–4.500)

## 2024-01-01 LAB — HEMOGLOBIN A1C
Est. average glucose Bld gHb Est-mCnc: 111 mg/dL
Hgb A1c MFr Bld: 5.5 % (ref 4.8–5.6)

## 2024-01-01 NOTE — Progress Notes (Signed)
 Results sent through MyChart

## 2024-07-19 ENCOUNTER — Telehealth: Payer: Self-pay | Admitting: Internal Medicine

## 2024-07-19 ENCOUNTER — Encounter: Payer: Self-pay | Admitting: Medical

## 2024-07-19 NOTE — Telephone Encounter (Signed)
 This patient no showed for their appointment today that was a CPE.Which of the following is necessary for this patient.   A) No follow-up necessary   B) Follow-up urgent. Locate Patient Immediately.   C) Follow-up necessary. Contact patient and Schedule visit in ____ Days.   D) Follow-up Advised. Contact patient and Schedule visit in ____ Days.  E) Please Send no show letter to patient. Charge no show fee if no show was a CPE.
